# Patient Record
Sex: Male | Born: 1985 | Race: White | Hispanic: No | Marital: Married | Smoking: Never smoker
Health system: Southern US, Community
[De-identification: ages and names within clinical notes are randomized; demographics above are authoritative.]

## PROBLEM LIST (undated history)

## (undated) DIAGNOSIS — J45909 Unspecified asthma, uncomplicated: Secondary | ICD-10-CM

## (undated) DIAGNOSIS — J3089 Other allergic rhinitis: Secondary | ICD-10-CM

## (undated) DIAGNOSIS — Z889 Allergy status to unspecified drugs, medicaments and biological substances status: Secondary | ICD-10-CM

## (undated) HISTORY — PX: TYMPANOSTOMY: SHX2586

---

## 2007-05-01 ENCOUNTER — Emergency Department (HOSPITAL_COMMUNITY): Admission: EM | Admit: 2007-05-01 | Discharge: 2007-05-01 | Payer: Self-pay | Admitting: Emergency Medicine

## 2011-01-05 LAB — POCT URINALYSIS DIP (DEVICE)
Bilirubin Urine: NEGATIVE
Glucose, UA: NEGATIVE
Ketones, ur: NEGATIVE
Nitrite: NEGATIVE
pH: 7

## 2012-09-19 DIAGNOSIS — J309 Allergic rhinitis, unspecified: Secondary | ICD-10-CM | POA: Insufficient documentation

## 2015-09-17 ENCOUNTER — Emergency Department
Admission: EM | Admit: 2015-09-17 | Discharge: 2015-09-17 | Disposition: A | Discharge status: Home / Self Care | Payer: BLUE CROSS/BLUE SHIELD | Source: Home / Self Care | Attending: Family Medicine | Admitting: Family Medicine

## 2015-09-17 DIAGNOSIS — J209 Acute bronchitis, unspecified: Secondary | ICD-10-CM | POA: Diagnosis not present

## 2015-09-17 HISTORY — DX: Allergy status to unspecified drugs, medicaments and biological substances: Z88.9

## 2015-09-17 MED ORDER — BENZONATATE 200 MG PO CAPS: 200.0000 mg | ORAL_CAPSULE | Freq: Three times a day (TID) | ORAL | Status: DC | PRN

## 2015-09-17 MED ORDER — AZITHROMYCIN 250 MG PO TABS: 250.0000 mg | ORAL_TABLET | Freq: Every day | ORAL | Status: DC

## 2015-09-17 MED ORDER — HYDROCODONE-HOMATROPINE 5-1.5 MG/5ML PO SYRP: 5.0000 mL | ORAL_SOLUTION | Freq: Three times a day (TID) | ORAL | Status: DC | PRN

## 2015-09-17 MED ORDER — PREDNISONE 10 MG PO TABS: 30.0000 mg | ORAL_TABLET | Freq: Every day | ORAL | Status: DC
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
## 2015-09-17 NOTE — ED Notes (Signed)
Marc ModenaJeremiah complains of sweats, cough and congestion for 4 days.

## 2015-09-17 NOTE — Discharge Instructions (Signed)
Thank you for coming in today. Take prednisone.  Use azithromycin antibiotics if not better.  Use cough medicine as needed.  Use hycodan sparingly and do not drive after taking it because it will make you sleepy.  Call or go to the emergency room if you get worse, have trouble breathing, have chest pains, or palpitations.    Acute Bronchitis Bronchitis is inflammation of the airways that extend from the windpipe into the lungs (bronchi). The inflammation often causes mucus to develop. This leads to a cough, which is the most common symptom of bronchitis.  In acute bronchitis, the condition usually develops suddenly and goes away over time, usually in a couple weeks. Smoking, allergies, and asthma can make bronchitis worse. Repeated episodes of bronchitis may cause further lung problems.  CAUSES Acute bronchitis is most often caused by the same virus that causes a cold. The virus can spread from person to person (contagious) through coughing, sneezing, and touching contaminated objects. SIGNS AND SYMPTOMS   Cough.   Fever.   Coughing up mucus.   Body aches.   Chest congestion.   Chills.   Shortness of breath.   Sore throat.  DIAGNOSIS  Acute bronchitis is usually diagnosed through a physical exam. Your health care provider will also ask you questions about your medical history. Tests, such as chest X-rays, are sometimes done to rule out other conditions.  TREATMENT  Acute bronchitis usually goes away in a couple weeks. Oftentimes, no medical treatment is necessary. Medicines are sometimes given for relief of fever or cough. Antibiotic medicines are usually not needed but may be prescribed in certain situations. In some cases, an inhaler may be recommended to help reduce shortness of breath and control the cough. A cool mist vaporizer may also be used to help thin bronchial secretions and make it easier to clear the chest.  HOME CARE INSTRUCTIONS  Get plenty of rest.    Drink enough fluids to keep your urine clear or pale yellow (unless you have a medical condition that requires fluid restriction). Increasing fluids may help thin your respiratory secretions (sputum) and reduce chest congestion, and it will prevent dehydration.   Take medicines only as directed by your health care provider.  If you were prescribed an antibiotic medicine, finish it all even if you start to feel better.  Avoid smoking and secondhand smoke. Exposure to cigarette smoke or irritating chemicals will make bronchitis worse. If you are a smoker, consider using nicotine gum or skin patches to help control withdrawal symptoms. Quitting smoking will help your lungs heal faster.   Reduce the chances of another bout of acute bronchitis by washing your hands frequently, avoiding people with cold symptoms, and trying not to touch your hands to your mouth, nose, or eyes.   Keep all follow-up visits as directed by your health care provider.  SEEK MEDICAL CARE IF: Your symptoms do not improve after 1 week of treatment.  SEEK IMMEDIATE MEDICAL CARE IF:  You develop an increased fever or chills.   You have chest pain.   You have severe shortness of breath.  You have bloody sputum.   You develop dehydration.  You faint or repeatedly feel like you are going to pass out.  You develop repeated vomiting.  You develop a severe headache. MAKE SURE YOU:   Understand these instructions.  Will watch your condition.  Will get help right away if you are not doing well or get worse.   This information is not intended  to replace advice given to you by your health care provider. Make sure you discuss any questions you have with your health care provider.   Document Released: 05/11/2004 Document Revised: 04/24/2014 Document Reviewed: 09/24/2012 Elsevier Interactive Patient Education [employer].

## 2015-09-24 ENCOUNTER — Ambulatory Visit (INDEPENDENT_AMBULATORY_CARE_PROVIDER_SITE_OTHER): Payer: BLUE CROSS/BLUE SHIELD | Admitting: Family Medicine

## 2015-09-24 ENCOUNTER — Encounter: Payer: Self-pay | Admitting: Family Medicine

## 2015-09-24 VITALS — BP 123/80 | HR 60 | Ht 74.0 in | Wt 240.0 lb

## 2015-09-24 DIAGNOSIS — L309 Dermatitis, unspecified: Secondary | ICD-10-CM | POA: Diagnosis not present

## 2015-09-24 DIAGNOSIS — Z789 Other specified health status: Secondary | ICD-10-CM

## 2015-09-24 DIAGNOSIS — Z720 Tobacco use: Secondary | ICD-10-CM | POA: Insufficient documentation

## 2015-09-24 DIAGNOSIS — IMO0001 Reserved for inherently not codable concepts without codable children: Secondary | ICD-10-CM

## 2015-09-24 DIAGNOSIS — Z0000 Encounter for general adult medical examination without abnormal findings: Secondary | ICD-10-CM

## 2015-09-24 MED ORDER — TRIAMCINOLONE ACETONIDE 0.1 % EX CREA: 1.0000 "application " | TOPICAL_CREAM | Freq: Two times a day (BID) | CUTANEOUS | Status: DC

## 2015-09-24 NOTE — Progress Notes (Signed)
       Marc BurnsJeremiah Lee is a 30 y.o. male who [facility] [place] today for well visit.  Patient works as a Nutritional therapistelectrical lineman. He uses oral tobacco and has frequent problems with allergies and serous otitis media. Occasionally he has a rash in his groin and axilla that he attributes to his personal protective equipment gear he is required to wear at work. He uses over-the-counter creams for this which helped some.  Otherwise he feels great with no other medical issues. No fevers or chills nausea vomiting or diarrhea. He gets lots of physical exercise at work and tries to eat as healthily as he can. He is not interested in quitting oral tobacco at this time.   Past Medical History  Diagnosis Date  . Multiple allergies    History reviewed. No pertinent past surgical history. Social History  Substance Use Topics  . Smoking status: Never Smoker   . Smokeless tobacco: Current User  . Alcohol Use: Yes   family history is not on file.  ROS as above:  Medications: Current Outpatient Prescriptions  Medication Sig Dispense Refill  . cetirizine (ZYRTEC) 10 MG tablet Take 10 mg by mouth daily.    Marland Kitchen. triamcinolone cream (KENALOG) 0.1 % Apply 1 application topically 2 (two) times daily. 453.6 g 5   No current facility-administered medications for this visit.   Allergies  Allergen Reactions  . Augmentin [Amoxicillin-Pot Clavulanate] Other (See Comments)    vomiting     Exam:  BP 123/80 mmHg  Pulse 60  Ht 6\' 2"  (1.88 m)  Wt 240 lb (108.863 kg)  BMI 30.80 kg/m2 Gen: Well NAD HEENT: EOMI,  MMM Tympanic membranes are retracted and scarred bilaterally with no fluid. Lungs: Normal work of breathing. CTABL Heart: RRR no MRG Abd: NABS, Soft. Nondistended, Nontender Exts: Brisk capillary refill, warm and well perfused.  Skin: Erythematous macular rash right axilla.  Nontender.  No results found for this or any previous visit (from the past 24 hour(s)). No results found.    Assessment and Plan: 30 y.o. male with  Will visit: Medications today include oral tobacco use and a irritant dermatitis in the right axilla. Encourage patient to reduce or quit oral tobacco usage and will use triamcinolone cream for the rash in the axilla. Otherwise for a sick well visit issues will check basic labs and see the patient yearly as needed.  Discussed warning signs or symptoms. Please see discharge instructions. Patient expresses understanding.

## 2015-09-24 NOTE — Patient Instructions (Signed)
Thank you for coming in today. Call or go to the emergency room if you get worse, have trouble breathing, have chest pains, or palpitations.  Please quit oral tobacco.  Get labs.  Use the cream as needed.  Return in 1 year or sooner if needed.

## 2015-09-25 LAB — HEMOGLOBIN A1C
HEMOGLOBIN A1C: 5.5 % (ref <5.7)
MEAN PLASMA GLUCOSE: 111 mg/dL

## 2015-09-25 LAB — CBC
HEMATOCRIT: 46.3 % (ref 38.5–50.0)
HEMOGLOBIN: 15.9 g/dL (ref 13.2–17.1)
MCH: 28.1 pg (ref 27.0–33.0)
MCHC: 34.3 g/dL (ref 32.0–36.0)
MCV: 81.9 fL (ref 80.0–100.0)
MPV: 9.9 fL (ref 7.5–12.5)
Platelets: 202 10*3/uL (ref 140–400)
RBC: 5.65 MIL/uL (ref 4.20–5.80)
RDW: 13.3 % (ref 11.0–15.0)
WBC: 8.1 10*3/uL (ref 3.8–10.8)

## 2015-09-25 LAB — COMPREHENSIVE METABOLIC PANEL
ALBUMIN: 4.3 g/dL (ref 3.6–5.1)
ALT: 32 U/L (ref 9–46)
AST: 22 U/L (ref 10–40)
Alkaline Phosphatase: 70 U/L (ref 40–115)
BUN: 10 mg/dL (ref 7–25)
CALCIUM: 9.4 mg/dL (ref 8.6–10.3)
CHLORIDE: 101 mmol/L (ref 98–110)
CO2: 25 mmol/L (ref 20–31)
CREATININE: 0.93 mg/dL (ref 0.60–1.35)
Glucose, Bld: 92 mg/dL (ref 65–99)
Potassium: 4.7 mmol/L (ref 3.5–5.3)
SODIUM: 138 mmol/L (ref 135–146)
TOTAL PROTEIN: 6.8 g/dL (ref 6.1–8.1)
Total Bilirubin: 0.6 mg/dL (ref 0.2–1.2)

## 2015-09-25 LAB — TSH: TSH: 0.81 mIU/L (ref 0.40–4.50)

## 2015-09-25 LAB — LIPID PANEL
CHOLESTEROL: 220 mg/dL — AB (ref 125–200)
HDL: 31 mg/dL — ABNORMAL LOW (ref >=40)
LDL Cholesterol: 126 mg/dL (ref <130)
TRIGLYCERIDES: 315 mg/dL — AB (ref <150)
Total CHOL/HDL Ratio: 7.1 Ratio — ABNORMAL HIGH (ref <=5.0)
VLDL: 63 mg/dL — AB (ref <30)

## 2015-09-27 ENCOUNTER — Encounter: Payer: Self-pay | Admitting: Family Medicine

## 2015-09-27 DIAGNOSIS — E785 Hyperlipidemia, unspecified: Secondary | ICD-10-CM | POA: Insufficient documentation

## 2015-09-27 NOTE — Progress Notes (Signed)
Quick Note:  Labs look ok except Cholesterol which is pretty bad. Work on reducing carbs in the diet. We will recheck in 1 year. Eventually your risk factors will be high enough to recommend cholesterol medicines but they are not there yet. ______

## 2015-12-13 ENCOUNTER — Ambulatory Visit: Payer: BLUE CROSS/BLUE SHIELD | Admitting: Family Medicine

## 2015-12-24 ENCOUNTER — Encounter: Payer: Self-pay | Admitting: Sports Medicine

## 2015-12-24 ENCOUNTER — Ambulatory Visit (INDEPENDENT_AMBULATORY_CARE_PROVIDER_SITE_OTHER): Payer: BLUE CROSS/BLUE SHIELD | Admitting: Sports Medicine

## 2015-12-24 ENCOUNTER — Ambulatory Visit (INDEPENDENT_AMBULATORY_CARE_PROVIDER_SITE_OTHER): Payer: BLUE CROSS/BLUE SHIELD

## 2015-12-24 DIAGNOSIS — M545 Low back pain, unspecified: Secondary | ICD-10-CM

## 2015-12-24 DIAGNOSIS — J209 Acute bronchitis, unspecified: Secondary | ICD-10-CM

## 2015-12-24 DIAGNOSIS — R05 Cough: Secondary | ICD-10-CM | POA: Diagnosis not present

## 2015-12-24 DIAGNOSIS — M4806 Spinal stenosis, lumbar region: Secondary | ICD-10-CM | POA: Diagnosis not present

## 2015-12-24 DIAGNOSIS — M5136 Other intervertebral disc degeneration, lumbar region: Secondary | ICD-10-CM | POA: Diagnosis not present

## 2015-12-24 MED ORDER — MELOXICAM 15 MG PO TABS: ORAL_TABLET | ORAL | 3 refills | Status: DC

## 2015-12-24 MED ORDER — BENZONATATE 200 MG PO CAPS: 200.0000 mg | ORAL_CAPSULE | Freq: Three times a day (TID) | ORAL | 0 refills | Status: DC | PRN

## 2015-12-24 MED ORDER — AZITHROMYCIN 250 MG PO TABS: ORAL_TABLET | ORAL | 0 refills | Status: DC

## 2015-12-24 NOTE — Assessment & Plan Note (Signed)
Pain is predominantly axial and discogenic. Home rehabilitation, meloxicam, x-rays. Has failed chiropractic manipulation, massage joint provided short-term relief. We did discuss the anatomy and natural history of back pain and degenerative disc disease.

## 2015-12-24 NOTE — Progress Notes (Signed)
   Subjective:    I'm seeing this patient as a consultation for:  Dr. Clementeen GrahamEvan Corey  CC: Back pain, cough  HPI: This is a pleasant 30 year old male, he's been coughing for a month now, he does not smoke but he is exposed to a great deal of secondhand smoke. Cough is minimally productive of yellowish sputum, no constitutional symptoms, no shortness of breath, cough does keep him up at night.  Low back pain: Right-sided, axial, worse with sitting, flexion Valsalva, no bowel or bladder dysfunction, saddle numbness, no constitutional symptoms.  Past medical history, Surgical history, Family history not pertinant except as noted below, Social history, Allergies, and medications have been entered into the medical record, reviewed, and no changes needed.   Review of Systems: No headache, visual changes, nausea, vomiting, diarrhea, constipation, dizziness, abdominal pain, skin rash, fevers, chills, night sweats, weight loss, swollen lymph nodes, body aches, joint swelling, muscle aches, chest pain, shortness of breath, mood changes, visual or auditory hallucinations.   Objective:   General: Well Developed, well nourished, and in no acute distress.  Neuro/Psych: Alert and oriented x3, extra-ocular muscles intact, able to move all 4 extremities, sensation grossly intact. Skin: Warm and dry, no rashes noted.  Respiratory: Not using accessory muscles, speaking in full sentences, trachea midline.  Cardiovascular: Pulses palpable, no extremity edema. Abdomen: Does not appear distended. Back Exam:  Inspection: Unremarkable  Motion: Flexion 45 deg, Extension 45 deg, Side Bending to 45 deg bilaterally,  Rotation to 45 deg bilaterally  SLR laying: Negative  XSLR laying: Negative  Palpable tenderness: None. FABER: negative. Sensory change: Gross sensation intact to all lumbar and sacral dermatomes.  Reflexes: 2+ at both patellar tendons, 2+ at achilles tendons, Babinski's downgoing.  Strength at foot    Plantar-flexion: 5/5 Dorsi-flexion: 5/5 Eversion: 5/5 Inversion: 5/5  Leg strength  Quad: 5/5 Hamstring: 5/5 Hip flexor: 5/5 Hip abductors: 5/5  Gait unremarkable. Ears nose and throat: Oropharynx, nasopharynx, ear canals unremarkable.  Impression and Recommendations:   This case required medical decision making of moderate complexity.  Acute bronchitis Chest x-ray, Tessalon Perles, azithromycin, symptoms present for one month.  Low back pain Pain is predominantly axial and discogenic. Home rehabilitation, meloxicam, x-rays. Has failed chiropractic manipulation, massage joint provided short-term relief. We did discuss the anatomy and natural history of back pain and degenerative disc disease.  I spent 25 minutes with this patient, greater than 50% was face-to-face time counseling regarding the above diagnoses

## 2015-12-24 NOTE — Assessment & Plan Note (Signed)
Chest x-ray, Tessalon Perles, azithromycin, symptoms present for one month.

## 2016-01-19 ENCOUNTER — Ambulatory Visit (INDEPENDENT_AMBULATORY_CARE_PROVIDER_SITE_OTHER): Payer: BLUE CROSS/BLUE SHIELD | Admitting: Family Medicine

## 2016-01-19 ENCOUNTER — Encounter: Payer: Self-pay | Admitting: Family Medicine

## 2016-01-19 VITALS — BP 117/62 | HR 61 | Ht 74.0 in | Wt 239.0 lb

## 2016-01-19 DIAGNOSIS — B029 Zoster without complications: Secondary | ICD-10-CM | POA: Diagnosis not present

## 2016-01-19 DIAGNOSIS — R238 Other skin changes: Secondary | ICD-10-CM | POA: Diagnosis not present

## 2016-01-19 MED ORDER — LIDOCAINE 4 % EX CREA: 1.0000 "application " | TOPICAL_CREAM | Freq: Three times a day (TID) | CUTANEOUS | 0 refills | Status: DC | PRN

## 2016-01-19 MED ORDER — VALACYCLOVIR HCL 1 G PO TABS: 1000.0000 mg | ORAL_TABLET | Freq: Three times a day (TID) | ORAL | 0 refills | Status: DC

## 2016-01-19 NOTE — Progress Notes (Signed)
Subjective:    Patient ID: Marc Lee, male    DOB: 18-Apr-1985, 30 y.o.   MRN: [id number]  HPI 30 year old male comes in today with a rash over the left flank area.  He noticed it about 4 days ago. He says it's tender to touch but otherwise is not extremely painful. He denies any itching or irritation. 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 That he doesn't normally breakout for poison ivy etc. He did switch soaps about a month ago. No fevers or chills. He reports that the pain does radiate towards the front abdominal area and occasionally will radiate towards the spine but it's intermittent.  Review of Systems  BP 117/62   Pulse 61   Ht 6\' 2"  (1.88 m)   Wt 239 lb (108.4 kg)   BMI 30.69 kg/m     Allergies  Allergen Reactions  . Augmentin [Amoxicillin-Pot Clavulanate] Other (See Comments)    vomiting    Past Medical History:  Diagnosis Date  . Multiple allergies     No past surgical history on file.  Social History   Social History  . Marital status: Married    Spouse name: N/A  . Number of children: N/A  . Years of education: N/A   Occupational History  . Not on file.   Social History Main Topics  . Smoking status: Never Smoker  . Smokeless tobacco: Current User  . Alcohol use Yes  . Drug use: No  . Sexual activity: Not on file   Other Topics Concern  . Not on file   Social History Narrative  . No narrative on file    No family history on file.  Outpatient Encounter Prescriptions as of 01/19/2016  Medication Sig  . cetirizine (ZYRTEC) 10 MG tablet Take 10 mg by mouth daily.  . meloxicam (MOBIC) 15 MG tablet One tab PO qAM with breakfast for 2 weeks, then daily prn pain.  . valACYclovir (VALTREX) 1000 MG tablet Take 1 tablet (1,000 mg total) by mouth 3 (three) times daily. X 7 day  . [DISCONTINUED] azithromycin (ZITHROMAX Z-PAK) 250 MG tablet Take 2 tablets (500 mg) on  Day 1,  followed by 1 tablet (250 mg) once  daily on Days 2 through 5.  . [DISCONTINUED] benzonatate (TESSALON) 200 MG capsule Take 1 capsule (200 mg total) by mouth 3 (three) times daily as needed for cough.  . [DISCONTINUED] triamcinolone cream (KENALOG) 0.1 % Apply 1 application topically 2 (two) times daily. (Patient not taking: Reported on 12/24/2015)   No facility-administered encounter medications on file as of 01/19/2016.        Objective:   Physical Exam  Constitutional: He is oriented to person, place, and time. He appears well-developed and well-nourished.  HENT:  Head: Normocephalic and atraumatic.  Neurological: He is oriented to person, place, and time.  Skin: Skin is warm and dry. Rash noted.  Erythematous rash with some fine vesicles within the papular area.    Psychiatric: He has a normal mood and affect. His behavior is normal.            Assessment & Plan:  Vesicular rash-blade used to lance a couple of vesicles and fluid was collected to send for evaluation for herpes zoster and herpes simplex. Will call with results once available. Also consider contact dermatitis. He is actually leaving town today summoning go ahead and put him on an antiviral and can use lidocaine cream for  pain relief. Call if rash is spreading or if he notices new lesions.

## 2016-01-23 LAB — RFLXH. SIMPLEX/VZ VIRUS CULT/DIF

## 2016-01-26 LAB — VIRAL CULTURE VIRC

## 2016-06-15 ENCOUNTER — Encounter: Payer: Self-pay | Admitting: Physician Assistant

## 2016-06-15 ENCOUNTER — Ambulatory Visit (INDEPENDENT_AMBULATORY_CARE_PROVIDER_SITE_OTHER): Payer: BLUE CROSS/BLUE SHIELD | Admitting: Physician Assistant

## 2016-06-15 VITALS — BP 131/78 | HR 77 | Temp 97.8°F | Wt 246.0 lb

## 2016-06-15 DIAGNOSIS — H6983 Other specified disorders of Eustachian tube, bilateral: Secondary | ICD-10-CM

## 2016-06-15 DIAGNOSIS — J069 Acute upper respiratory infection, unspecified: Secondary | ICD-10-CM

## 2016-06-15 MED ORDER — AZITHROMYCIN 250 MG PO TABS: ORAL_TABLET | ORAL | 0 refills | Status: DC

## 2016-06-15 MED ORDER — BENZONATATE 200 MG PO CAPS: 200.0000 mg | ORAL_CAPSULE | Freq: Three times a day (TID) | ORAL | 0 refills | Status: DC | PRN

## 2016-06-15 MED ORDER — FLUTICASONE PROPIONATE 50 MCG/ACT NA SUSP: 2.0000 | Freq: Every day | NASAL | 6 refills | Status: DC

## 2016-06-15 NOTE — Patient Instructions (Addendum)
Flonase 1 spray each nostril twice a day Take antibiotic as prescribed

## 2016-06-15 NOTE — Progress Notes (Signed)
HPI:                                                                Marc BurnsJeremiah Lee is a 31 y.o. male who [facility] [place] today for cough and cold symptoms  Patient reports cough and sinus congestion x 2 weeks. States today developed bilateral ear fullness and he has the sensation that ears need to pop. Patient reports a remote history of chronic ear infections requiring multiple tympanostomy tubes. He is not currently followed by ENT.  Endorses cough productive of mucus, wheezing, and dyspnea. Denies chest pain or hemoptysis. Denies fever, chills, malaise. Nonsmoker. Denies history of asthma/COPD. He has tried OTC sinus medication and 2 left-over antibiotic pills. 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   Past Medical History:  Diagnosis Date  . Multiple allergies    No past surgical history on file. Social History  Substance Use Topics  . Smoking status: Never Smoker  . Smokeless tobacco: Current User  . Alcohol use Yes   family history is not on file.  ROS: negative except as noted in the HPI  Medications: Current Outpatient Prescriptions  Medication Sig Dispense Refill  . cetirizine (ZYRTEC) 10 MG tablet Take 10 mg by mouth daily.    Marland Kitchen. lidocaine (LMX) 4 % cream Apply 1 application topically 3 (three) times daily as needed. Ok to substitute different strength if this one not covered. 120 g 0  . meloxicam (MOBIC) 15 MG tablet One tab PO qAM with breakfast for 2 weeks, then daily prn pain. 30 tablet 3  . valACYclovir (VALTREX) 1000 MG tablet Take 1 tablet (1,000 mg total) by mouth 3 (three) times daily. X 7 day 21 tablet 0   No current facility-administered medications for this visit.    Allergies  Allergen Reactions  . Augmentin [Amoxicillin-Pot Clavulanate] Other (See Comments)    vomiting       Objective:  BP 131/78   Pulse 77   Temp 97.8 F (36.6 C) (Oral)   Wt 246 lb  (111.6 kg)   BMI 31.58 kg/m  Gen: well-groomed, cooperative, not ill-appearing, no distress HEENT: normal conjunctiva, TM's retracted, nasal mucosa edematous, oropharynx clear, tonsils grade 3, no exudates, moist mucus membranes, no frontal or maxillary sinus tenderness Pulm: Normal work of breathing, normal phonation, clear to auscultation bilaterally, no wheezes, rales or rhonchi CV: Normal rate, regular rhythm, s1 and s2 distinct, no murmurs, clicks or rubs  Neuro: alert and oriented x 3, EOM's intact Lymph: no cervical or tonsillar adenopathy Skin: warm and dry, no rashes or lesions on exposed skin, no cyanosis   No results found for this or any previous visit (from the past 72 hour(s)). No results found.    Assessment and Plan: 31 y.o. male with   1. Dysfunction of both eustachian tubes - fluticasone (FLONASE) 50 MCG/ACT nasal spray; Place 2 sprays into both nostrils daily.  Dispense: 16 g; Refill: 6  2. Acute upper respiratory infection - will cover for CAP given duration of symptoms and patient will be going out of town for work. Lung sounds clear today. - azithromycin (ZITHROMAX Z-PAK) 250 MG tablet; Take 2 tablets (500 mg) on  Day 1,  followed by 1 tablet (250 mg) once daily on Days 2 through 5.  Dispense: 6 tablet; Refill: 0 - benzonatate (TESSALON) 200 MG capsule; Take 1 capsule (200 mg total) by mouth 3 (three) times daily as needed for cough.  Dispense: 45 capsule; Refill: 0   Patient education and anticipatory guidance given Patient agrees with treatment plan Follow-up as needed if symptoms worsen or fail to improve  Levonne Hubert PA-C

## 2016-11-09 ENCOUNTER — Ambulatory Visit (INDEPENDENT_AMBULATORY_CARE_PROVIDER_SITE_OTHER): Payer: BLUE CROSS/BLUE SHIELD | Admitting: Family Medicine

## 2016-11-09 ENCOUNTER — Encounter: Payer: Self-pay | Admitting: Family Medicine

## 2016-11-09 VITALS — BP 128/77 | HR 81 | Wt 253.0 lb

## 2016-11-09 DIAGNOSIS — H60331 Swimmer's ear, right ear: Secondary | ICD-10-CM | POA: Diagnosis not present

## 2016-11-09 MED ORDER — AZITHROMYCIN 250 MG PO TABS: 250.0000 mg | ORAL_TABLET | Freq: Every day | ORAL | 0 refills | Status: DC

## 2016-11-09 MED ORDER — NEOMYCIN-POLYMYXIN-HC 3.5-10000-1 OT SOLN: 4.0000 [drp] | Freq: Four times a day (QID) | OTIC | 0 refills | Status: DC

## 2016-11-09 NOTE — Patient Instructions (Signed)
Thank you for coming in today. Use the antibiotic ear drop for a few days.  Use backup oral antibiotic if not getting better.   Use rubbing alcohol to prevent the next one.    Otitis Externa Otitis externa is an infection of the outer ear canal. The outer ear canal is the area between the outside of the ear and the eardrum. Otitis externa is sometimes called "swimmer's ear." What are the causes? This condition may be caused by:  Swimming in dirty water.  Moisture in the ear.  An injury to the inside of the ear.  An object stuck in the ear.  A cut or scrape on the outside of the ear.  What increases the risk? This condition is more likely to develop in swimmers. What are the signs or symptoms? The first symptom of this condition is often itching in the ear. Later signs and symptoms include:  Swelling of the ear.  Redness in the ear.  Ear pain. The pain may get worse when you pull on your ear.  Pus coming from the ear.  How is this diagnosed? This condition may be diagnosed by examining the ear and testing fluid from the ear for bacteria and funguses. How is this treated? This condition may be treated with:  Antibiotic ear drops. These are often given for 10-14 days.  Medicine to reduce itching and swelling.  Follow these instructions at home:  If you were prescribed antibiotic ear drops, apply them as told by your health care provider. Do not stop using the antibiotic even if your condition improves.  Take over-the-counter and prescription medicines only as told by your health care provider.  Keep all follow-up visits as told by your health care provider. This is important. How is this prevented?  Keep your ear dry. Use the corner of a towel to dry your ear after you swim or bathe.  Avoid scratching or putting things in your ear. Doing these things can damage the ear canal or remove the protective wax that lines it, which makes it easier for bacteria and funguses  to grow.  Avoid swimming in lakes, polluted water, or pools that may not have the right amount of chlorine.  Consider making ear drops and putting 3 or 4 drops in each ear after you swim. Ask your health care provider about how you can make ear drops. Contact a health care provider if:  You have a fever.  After 3 days your ear is still red, swollen, painful, or draining pus.  Your redness, swelling, or pain gets worse.  You have a severe headache.  You have redness, swelling, pain, or tenderness in the area behind your ear. This information is not intended to replace advice given to you by your health care provider. Make sure you discuss any questions you have with your health care provider. Document Released: 04/03/2005 Document Revised: 05/11/2015 Document Reviewed: 01/11/2015 Elsevier Interactive Patient Education  [employer]2018 [employer]

## 2016-11-09 NOTE — Progress Notes (Signed)
Marc BurnsJeremiah Lee is a 31 y.o. male who [facility] today for ear pain.  Patient states that ear pain began about 5 days ago. He has an extensive history of ear infections and states that this feels exactly like those episodes. He reports decreased hearing during this period. Patient just returned from a vacation to the beach during which he swam often. He denies any fevers or sinus congestion. Patient reports some pain which chewing, but states this has improved since the onset of his symptoms.    Past Medical History:  Diagnosis Date  . Multiple allergies    Past Surgical History:  Procedure Laterality Date  . TYMPANOSTOMY     Social History  Substance Use Topics  . Smoking status: Never Smoker  . Smokeless tobacco: Current User  . Alcohol use Yes   family history is not on file.  ROS as above:  Medications: Current Outpatient Prescriptions  Medication Sig Dispense Refill  . cetirizine (ZYRTEC) 10 MG tablet Take 10 mg by mouth daily.    Marland Kitchen. azithromycin (ZITHROMAX) 250 MG tablet Take 1 tablet (250 mg total) by mouth daily. Take first 2 tablets together, then 1 every day until finished. 6 tablet 0  . neomycin-polymyxin-hydrocortisone (CORTISPORIN) OTIC solution Place 4 drops into the right ear 4 (four) times daily. 10 mL 0   No current facility-administered medications for this visit.    Allergies  Allergen Reactions  . Augmentin [Amoxicillin-Pot Clavulanate] Other (See Comments)    vomiting    Health Maintenance Health Maintenance  Topic Date Due  . HIV Screening  10/25/1999  . TETANUS/TDAP  10/25/2003  . INFLUENZA VACCINE  11/15/2016     Exam:  BP 128/77   Pulse 81   Wt 253 lb (114.8 kg)   SpO2 98%   BMI 32.48 kg/m  Gen: Well NAD HEENT: EOMI,  MMM, skin peeling and sunburn present on ears, no ear discharge noted, erythema present  in external ear canal, tympanic membranes appear white and opaque, no bulging appreciated Lungs: Normal work of breathing. CTABL Heart: RRR no MRG Abd: NABS, Soft. Nondistended, Nontender Exts: Brisk capillary refill, warm and well perfused.    No results found for this or any previous visit (from the past 72 hour(s)). No results found.    Assessment and Plan: 31 y.o. male with ear pain. Given patient's history of ear infections and recent activities, this is most likely otitis externa. Patient was given a prescription for Cortisporin ear drops and instructed to use these 4 times daily. Patient should expect symptoms to resolve in the next few days. If symptoms do not resolve, patient should take azithromycin and continue to monitor symptoms for resolution. Patient was instructed to apply a few drops of rubbing alcohol to the ear after each swimming session to help dry out ear and reduce number of ear infections.   No orders of the defined types were placed in this encounter.  Meds ordered this encounter  Medications  . neomycin-polymyxin-hydrocortisone (CORTISPORIN) OTIC solution    Sig: Place 4 drops into the right ear 4 (four) times daily.    Dispense:  10 mL    Refill:  0  . azithromycin (ZITHROMAX) 250 MG tablet    Sig: Take 1 tablet (250 mg total) by mouth daily. Take first 2 tablets together, then 1 every day until finished.    Dispense:  6 tablet    Refill:  0  Discussed warning signs or symptoms. Please see discharge instructions. Patient expresses understanding.

## 2017-02-11 ENCOUNTER — Encounter: Payer: Self-pay | Admitting: Emergency Medicine

## 2017-02-11 ENCOUNTER — Emergency Department
Admission: EM | Admit: 2017-02-11 | Discharge: 2017-02-11 | Disposition: A | Discharge status: Home / Self Care | Payer: BLUE CROSS/BLUE SHIELD | Source: Home / Self Care

## 2017-02-11 DIAGNOSIS — J0120 Acute ethmoidal sinusitis, unspecified: Secondary | ICD-10-CM | POA: Diagnosis not present

## 2017-02-11 MED ORDER — AZITHROMYCIN 250 MG PO TABS: 250.0000 mg | ORAL_TABLET | Freq: Every day | ORAL | 0 refills | Status: DC

## 2017-02-11 MED ORDER — HYDROCODONE-HOMATROPINE 5-1.5 MG/5ML PO SYRP: 5.0000 mL | ORAL_SOLUTION | Freq: Four times a day (QID) | ORAL | 0 refills | Status: DC | PRN

## 2017-02-11 NOTE — ED Triage Notes (Signed)
Patient has been congested and coughing for 4 days; believes he may have a sinus infection. Took tylenol and mucinex dm 5 hours ago.

## 2017-02-11 NOTE — ED Provider Notes (Signed)
[facility]    CSN: [id number] Arrival date & time: 02/11/17  1557     History   Chief Complaint No chief complaint on file.   HPI Marc Lee is a 31 y.o. male.   The history is provided by the patient. No language interpreter was used.  Cough  Cough characteristics:  Productive Sputum characteristics:  Manson Passey Severity:  Moderate Onset quality:  Gradual Duration:  4 days Timing:  Constant Progression:  Worsening Chronicity:  New Smoker: no   Context: sick contacts   Relieved by:  Nothing Worsened by:  Nothing Ineffective treatments:  None tried Associated symptoms: sinus congestion   Associated symptoms: no chest pain and no fever   Pt reports a history of sinus infections.  Pt complains of not being able to sleep due to cough.   Past Medical History:  Diagnosis Date  . Multiple allergies     Patient Active Problem List   Diagnosis Date Noted  . Dysfunction of both eustachian tubes 06/15/2016  . Low back pain 12/24/2015  . Acute bronchitis 12/24/2015  . Hyperlipidemia 09/27/2015  . Well adult 09/24/2015  . Dermatitis 09/24/2015  . Tobacco dipper 09/24/2015  . Allergic rhinitis 09/19/2012    Past Surgical History:  Procedure Laterality Date  . TYMPANOSTOMY         Home Medications    Prior to Admission medications   Medication Sig Start Date End Date Taking? Authorizing Provider  azithromycin (ZITHROMAX) 250 MG tablet Take 1 tablet (250 mg total) by mouth daily. Take first 2 tablets together, then 1 every day until finished. 11/09/16   Rodolph Bong, MD  cetirizine (ZYRTEC) 10 MG tablet Take 10 mg by mouth daily.    [provider]  neomycin-polymyxin-hydrocortisone (CORTISPORIN) OTIC solution Place 4 drops into the right ear 4 (four) times daily. 11/09/16   Rodolph Bong, MD    Family History No family history on file.  Social History Social History  Substance Use Topics  . Smoking status: Never Smoker  . Smokeless  tobacco: Current User  . Alcohol use Yes     Allergies   Augmentin [amoxicillin-pot clavulanate]   Review of Systems Review of Systems  Constitutional: Negative for fever.  Respiratory: Positive for cough.   Cardiovascular: Negative for chest pain.  All other systems reviewed and are negative.    Physical Exam Triage Vital Signs ED Triage Vitals  Enc Vitals Group     BP      Pulse      Resp      Temp      Temp src      SpO2      Weight      Height      Head Circumference      Peak Flow      Pain Score      Pain Loc      Pain Edu?      Excl. in GC?    No data found.   Updated Vital Signs There were no vitals taken for this visit.  Visual Acuity Right Eye Distance:   Left Eye Distance:   Bilateral Distance:    Right Eye Near:   Left Eye Near:    Bilateral Near:     Physical Exam  Constitutional: He appears well-developed and well-nourished.  HENT:  Head: Normocephalic and atraumatic.  Right Ear: External ear normal.  Left Ear: External ear normal.  Tender maxillary sinuses.  Eyes: Conjunctivae are normal.  Neck: Neck supple.  Cardiovascular: Normal rate and regular rhythm.   No murmur heard. Pulmonary/Chest: Effort normal and breath sounds normal. No respiratory distress.  Abdominal: Soft. There is no tenderness.  Musculoskeletal: He exhibits no edema.  Neurological: He is alert.  Skin: Skin is warm and dry.  Psychiatric: He has a normal mood and affect.  Nursing note and vitals reviewed.    UC Treatments / Results  Labs (all labs ordered are listed, but only abnormal results are displayed) Labs Reviewed - No data to display  EKG  EKG Interpretation None       Radiology No results found.  Procedures Procedures (including critical care time)  Medications Ordered in UC Medications - No data to display   Initial Impression / Assessment and Plan / UC Course  I have reviewed the triage vital signs and the nursing  notes.  Pertinent labs & imaging results that were available during my care of the patient were reviewed by me and considered in my medical decision making (see chart for details).       Final Clinical Impressions(s) / UC Diagnoses   Final diagnoses:  Acute non-recurrent ethmoidal sinusitis    New Prescriptions New Prescriptions   HYDROCODONE-HOMATROPINE (HYCODAN) 5-1.5 MG/5ML SYRUP    Take 5 mLs by mouth every 6 (six) hours as needed for cough.   Meds ordered this encounter  Medications  . azithromycin (ZITHROMAX) 250 MG tablet    Sig: Take 1 tablet (250 mg total) by mouth daily. Take first 2 tablets together, then 1 every day until finished.    Dispense:  6 tablet    Refill:  0    Order Specific Question:   Supervising Provider    Answer:   Georgina PillionMASSEY, DAVID [[id number]]  . HYDROcodone-homatropine (HYCODAN) 5-1.5 MG/5ML syrup    Sig: Take 5 mLs by mouth every 6 (six) hours as needed for cough.    Dispense:  100 mL    Refill:  0    Order Specific Question:   Supervising Provider    Answer:   Georgina PillionMASSEY, DAVID [[id number]]  An After Visit Summary was printed and given to the patient.    Controlled Substance Prescriptions [place] Controlled Substance Registry consulted? Yes, I have consulted the [place] Controlled Substances Registry for this patient, and feel the risk/benefit ratio today is favorable for proceeding with this prescription for a controlled substance.   Elson AreasSofia, Leslie K, [place]-C 02/11/17 1723

## 2017-04-15 ENCOUNTER — Other Ambulatory Visit: Payer: Self-pay

## 2017-04-15 ENCOUNTER — Emergency Department
Admission: EM | Admit: 2017-04-15 | Discharge: 2017-04-15 | Disposition: A | Discharge status: Home / Self Care | Payer: BLUE CROSS/BLUE SHIELD | Source: Home / Self Care | Attending: Family Medicine | Admitting: Family Medicine

## 2017-04-15 ENCOUNTER — Encounter: Payer: Self-pay | Admitting: Emergency Medicine

## 2017-04-15 DIAGNOSIS — H6691 Otitis media, unspecified, right ear: Secondary | ICD-10-CM

## 2017-04-15 DIAGNOSIS — J039 Acute tonsillitis, unspecified: Secondary | ICD-10-CM

## 2017-04-15 DIAGNOSIS — Z20818 Contact with and (suspected) exposure to other bacterial communicable diseases: Secondary | ICD-10-CM

## 2017-04-15 MED ORDER — AMOXICILLIN 500 MG PO CAPS: 500.0000 mg | ORAL_CAPSULE | Freq: Two times a day (BID) | ORAL | 0 refills | Status: AC

## 2017-04-15 NOTE — ED Provider Notes (Signed)
[facility]    CSN: [id number] Arrival date & time: 04/15/17  1206     History   Chief Complaint Chief Complaint  Patient presents with  . Sore Throat  . Otalgia    right    HPI Briscoe BurnsJeremiah Lizardo is a 31 y.o. male.   HPI  Briscoe BurnsJeremiah Kirkland is a 31 y.o. male presenting to UC with c/o 3-4 days of Right ear pain and sore throat with minimal congestion.  His son recently tested positive for strep throat.  Pt denies fever, chills, n/v/d. He has not tried any OTC medications for his symptoms but wanted to make sure he did not need an antibiotic.    Past Medical History:  Diagnosis Date  . Multiple allergies     Patient Active Problem List   Diagnosis Date Noted  . Dysfunction of both eustachian tubes 06/15/2016  . Low back pain 12/24/2015  . Acute bronchitis 12/24/2015  . Hyperlipidemia 09/27/2015  . Well adult 09/24/2015  . Dermatitis 09/24/2015  . Tobacco dipper 09/24/2015  . Allergic rhinitis 09/19/2012    Past Surgical History:  Procedure Laterality Date  . TYMPANOSTOMY         Home Medications    Prior to Admission medications   Medication Sig Start Date End Date Taking? Authorizing Provider  amoxicillin (AMOXIL) 500 MG capsule Take 1 capsule (500 mg total) by mouth 2 (two) times daily for 10 days. 04/15/17 04/25/17  Lurene ShadowPhelps, Sage Kopera O, PA-C  azithromycin (ZITHROMAX) 250 MG tablet Take 1 tablet (250 mg total) by mouth daily. Take first 2 tablets together, then 1 every day until finished. 02/11/17   Elson AreasSofia, Leslie K, PA-C  cetirizine (ZYRTEC) 10 MG tablet Take 10 mg by mouth daily.    [provider]  HYDROcodone-homatropine (HYCODAN) 5-1.5 MG/5ML syrup Take 5 mLs by mouth every 6 (six) hours as needed for cough. 02/11/17   Elson AreasSofia, Leslie K, PA-C  neomycin-polymyxin-hydrocortisone (CORTISPORIN) OTIC solution Place 4 drops into the right ear 4 (four) times daily. 11/09/16   Rodolph Bongorey, Evan S, MD    Family History History reviewed. No pertinent family  history.  Social History Social History   Tobacco Use  . Smoking status: Never Smoker  . Smokeless tobacco: Current User  Substance Use Topics  . Alcohol use: Yes  . Drug use: No     Allergies   Augmentin [amoxicillin-pot clavulanate]   Review of Systems Review of Systems  Constitutional: Negative for chills and fever.  HENT: Positive for congestion (minimal), ear pain (Right) and sore throat. Negative for trouble swallowing and voice change.   Respiratory: Negative for cough and shortness of breath.   Cardiovascular: Negative for chest pain and palpitations.  Gastrointestinal: Negative for abdominal pain, diarrhea, nausea and vomiting.  Musculoskeletal: Negative for arthralgias, back pain and myalgias.  Skin: Negative for rash.  Neurological: Positive for headaches. Negative for dizziness and light-headedness.     Physical Exam Triage Vital Signs ED Triage Vitals  Enc Vitals Group     BP 04/15/17 1234 104/70     Pulse Rate 04/15/17 1234 70     Resp 04/15/17 1234 16     Temp 04/15/17 1234 98.5 F (36.9 C)     Temp Source 04/15/17 1234 Oral     SpO2 04/15/17 1234 98 %     Weight 04/15/17 1235 238 lb (108 kg)     Height 04/15/17 1235 6\' 1"  (1.854 m)     Head Circumference --  Peak Flow --      Pain Score 04/15/17 1235 3     Pain Loc --      Pain Edu? --      Excl. in GC? --    No data found.  Updated Vital Signs BP 104/70 (BP Location: Right Arm)   Pulse 70   Temp 98.5 F (36.9 C) (Oral)   Resp 16   Ht 6\' 1"  (1.854 m)   Wt 238 lb (108 kg)   SpO2 98%   BMI 31.40 kg/m   Visual Acuity Right Eye Distance:   Left Eye Distance:   Bilateral Distance:    Right Eye Near:   Left Eye Near:    Bilateral Near:     Physical Exam  Constitutional: He is oriented to person, place, and time. He appears well-developed and well-nourished.  Non-toxic appearance. He does not appear ill. No distress.  HENT:  Head: Normocephalic and atraumatic.  Right Ear:  Tympanic membrane is erythematous and bulging.  Left Ear: Tympanic membrane is injected.  Nose: Nose normal.  Mouth/Throat: Uvula is midline and mucous membranes are normal. Posterior oropharyngeal edema and posterior oropharyngeal erythema present. No oropharyngeal exudate or tonsillar abscesses.  Eyes: EOM are normal.  Neck: Normal range of motion. Neck supple.  Cardiovascular: Normal rate and regular rhythm.  Pulmonary/Chest: Effort normal and breath sounds normal. No stridor. No respiratory distress. He has no wheezes. He has no rhonchi. He has no rales.  Musculoskeletal: Normal range of motion.  Lymphadenopathy:    He has cervical adenopathy.  Neurological: He is alert and oriented to person, place, and time.  Skin: Skin is warm and dry.  Psychiatric: He has a normal mood and affect. His behavior is normal.  Nursing note and vitals reviewed.    UC Treatments / Results  Labs (all labs ordered are listed, but only abnormal results are displayed) Labs Reviewed - No data to display  EKG  EKG Interpretation None       Radiology No results found.  Procedures Procedures (including critical care time)  Medications Ordered in UC Medications - No data to display   Initial Impression / Assessment and Plan / UC Course  I have reviewed the triage vital signs and the nursing notes.  Pertinent labs & imaging results that were available during my care of the patient were reviewed by me and considered in my medical decision making (see chart for details).     Exam c/w Right AOM and tonsillitis. Pt will be started on Amoxicillin for AOM, discussed strep test. Pt agreeable to hold off as pt will be on appropriate antibiotic regardless of results.  Encouraged fluids, rest, acetaminophen and ibuprofen F/u with PCP in 1 week if needed.   Final Clinical Impressions(s) / UC Diagnoses   Final diagnoses:  Right acute otitis media  Acute tonsillitis, unspecified etiology  Exposure  to strep throat    ED Discharge Orders        Ordered    amoxicillin (AMOXIL) 500 MG capsule  2 times daily     04/15/17 1307       Controlled Substance Prescriptions [place] Controlled Substance Registry consulted? Not Applicable   Rolla Platehelps, Jerell Demery O, PA-C 04/15/17 1354

## 2017-04-15 NOTE — Discharge Instructions (Signed)
°  You may take 500mg acetaminophen every 4-6 hours or in combination with ibuprofen 400-600mg every 6-8 hours as needed for pain, inflammation, and fever. ° °Be sure to drink at least eight 8oz glasses of water to stay well hydrated and get at least 8 hours of sleep at night, preferably more while sick.  ° °Please take antibiotics as prescribed and be sure to complete entire course even if you start to feel better to ensure infection does not come back. ° °

## 2017-04-15 NOTE — ED Triage Notes (Signed)
Patient has son with Strep and ear infection; he began with sore throat and right ear pain 'couple days ago'; no known fever; no OTCs today.

## 2017-04-18 ENCOUNTER — Telehealth: Payer: Self-pay

## 2017-04-18 NOTE — Telephone Encounter (Signed)
Spoke with patient, doing well.  Will take medication as prescribed.  Will follow up with UC or PCP as needed.

## 2017-11-03 ENCOUNTER — Emergency Department (INDEPENDENT_AMBULATORY_CARE_PROVIDER_SITE_OTHER): Payer: BLUE CROSS/BLUE SHIELD

## 2017-11-03 ENCOUNTER — Emergency Department (INDEPENDENT_AMBULATORY_CARE_PROVIDER_SITE_OTHER)
Admission: EM | Admit: 2017-11-03 | Discharge: 2017-11-03 | Disposition: A | Discharge status: Home / Self Care | Payer: BLUE CROSS/BLUE SHIELD | Source: Home / Self Care | Attending: Family Medicine | Admitting: Family Medicine

## 2017-11-03 ENCOUNTER — Other Ambulatory Visit: Payer: Self-pay

## 2017-11-03 DIAGNOSIS — M25532 Pain in left wrist: Secondary | ICD-10-CM | POA: Diagnosis not present

## 2017-11-03 DIAGNOSIS — S6992XA Unspecified injury of left wrist, hand and finger(s), initial encounter: Secondary | ICD-10-CM | POA: Diagnosis not present

## 2017-11-03 DIAGNOSIS — S63502A Unspecified sprain of left wrist, initial encounter: Secondary | ICD-10-CM | POA: Diagnosis not present

## 2017-11-03 MED ORDER — ACETAMINOPHEN 325 MG PO TABS
975.0000 mg | ORAL_TABLET | Freq: Once | ORAL | Status: AC
  Administered 2017-11-03: 975 mg via ORAL

## 2017-11-03 NOTE — ED Provider Notes (Signed)
[facility]    CSN: [id number] Arrival date & time: 11/03/17  1002     History   Chief Complaint Chief Complaint  Patient presents with  . Wrist Pain    HPI Briscoe BurnsJeremiah Falter is a 32 y.o. male.   Patient fell off a ladder yesterday, bracing himself with his hands.  He subsequently had pain/swelling in his left wrist.  The history is provided by the patient.  Wrist Pain  This is a new problem. The current episode started yesterday. The problem occurs constantly. The problem has not changed since onset.Exacerbated by: wrist movement. Nothing relieves the symptoms. Treatments tried: ice pack. The treatment provided mild relief.    Past Medical History:  Diagnosis Date  . Multiple allergies     Patient Active Problem List   Diagnosis Date Noted  . Dysfunction of both eustachian tubes 06/15/2016  . Low back pain 12/24/2015  . Acute bronchitis 12/24/2015  . Hyperlipidemia 09/27/2015  . Well adult 09/24/2015  . Dermatitis 09/24/2015  . Tobacco dipper 09/24/2015  . Allergic rhinitis 09/19/2012    Past Surgical History:  Procedure Laterality Date  . TYMPANOSTOMY         Home Medications    Prior to Admission medications   Medication Sig Start Date End Date Taking? Authorizing Provider  azithromycin (ZITHROMAX) 250 MG tablet Take 1 tablet (250 mg total) by mouth daily. Take first 2 tablets together, then 1 every day until finished. 02/11/17   Elson AreasSofia, Leslie K, PA-C  cetirizine (ZYRTEC) 10 MG tablet Take 10 mg by mouth daily.    [provider]  HYDROcodone-homatropine (HYCODAN) 5-1.5 MG/5ML syrup Take 5 mLs by mouth every 6 (six) hours as needed for cough. 02/11/17   Elson AreasSofia, Leslie K, PA-C  neomycin-polymyxin-hydrocortisone (CORTISPORIN) OTIC solution Place 4 drops into the right ear 4 (four) times daily. 11/09/16   Rodolph Bongorey, Evan S, MD    Family History No family history on file.  Social History Social History   Tobacco Use  . Smoking status:  Never Smoker  . Smokeless tobacco: Current User  Substance Use Topics  . Alcohol use: Yes  . Drug use: No     Allergies   Augmentin [amoxicillin-pot clavulanate]   Review of Systems Review of Systems  All other systems reviewed and are negative.    Physical Exam Triage Vital Signs ED Triage Vitals  Enc Vitals Group     BP 11/03/17 1026 112/71     Pulse Rate 11/03/17 1026 62     Resp 11/03/17 1026 16     Temp 11/03/17 1026 97.6 F (36.4 C)     Temp Source 11/03/17 1026 Oral     SpO2 11/03/17 1026 98 %     Weight 11/03/17 1027 243 lb (110.2 kg)     Height 11/03/17 1027 6\' 1"  (1.854 m)     Head Circumference --      Peak Flow --      Pain Score 11/03/17 1027 8     Pain Loc --      Pain Edu? --      Excl. in GC? --    No data found.  Updated Vital Signs BP 112/71 (BP Location: Right Arm)   Pulse 62   Temp 97.6 F (36.4 C) (Oral)   Resp 16   Ht 6\' 1"  (1.854 m)   Wt 243 lb (110.2 kg)   SpO2 98%   BMI 32.06 kg/m   Visual Acuity Right Eye Distance:  Left Eye Distance:   Bilateral Distance:    Right Eye Near:   Left Eye Near:    Bilateral Near:     Physical Exam  Constitutional: He appears well-developed and well-nourished. No distress.  HENT:  Head: Atraumatic.  Eyes: Pupils are equal, round, and reactive to light.  Cardiovascular: Normal rate.  Pulmonary/Chest: Effort normal.  Musculoskeletal:       Left wrist: He exhibits decreased range of motion, tenderness, bony tenderness and swelling. He exhibits no effusion, no crepitus, no deformity and no laceration.       Arms: Patient has pain with ulnar lateral flexion of left wrist.  There is tenderness over the ulnar aspect of wrist distal to ulnar stylus.  Mild decreased range of motion.  No tenderness to palpation over snuffbox.  Distal neurovascular function is intact.  Neurological: He is alert.  Skin: Skin is warm and dry.  Nursing note and vitals reviewed.    UC Treatments / Results   Labs (all labs ordered are listed, but only abnormal results are displayed) Labs Reviewed - No data to display  EKG None  Radiology Dg Wrist Complete Left  Result Date: 11/03/2017 CLINICAL DATA:  Fall from ladder, ulnar wrist pain EXAM: LEFT WRIST - COMPLETE 3+ VIEW COMPARISON:  None. FINDINGS: There is no evidence of fracture or dislocation. There is no evidence of arthropathy or other focal bone abnormality. Soft tissues are unremarkable. IMPRESSION: No acute osseous finding Electronically Signed   By: Judie Petit.  Shick M.D.   On: 11/03/2017 10:46    Procedures Procedures (including critical care time)  Medications Ordered in UC Medications  acetaminophen (TYLENOL) tablet 975 mg (975 mg Oral Given 11/03/17 1034)    Initial Impression / Assessment and Plan / UC Course  I have reviewed the triage vital signs and the nursing notes.  Pertinent labs & imaging results that were available during my care of the patient were reviewed by me and considered in my medical decision making (see chart for details).    Wrist splint applied. Doubt TFCC injury, but emphasized to patient that he should follow-up with Sports Medicine if not improving in about 2 weeks.   Final Clinical Impressions(s) / UC Diagnoses   Final diagnoses:  Sprain of left wrist, initial encounter     Discharge Instructions     Apply ice pack for 20 minutes every 1 to 2 hours today and tomorrow.  Elevate.  Wear brace for about 7 to 10 days.  Begin range of motion and stretching exercises in about 5 days as per instruction sheet.  May take Ibuprofen 200mg , 4 tabs every 8 hours with food.     ED Prescriptions    None        Lattie Haw, MD 11/03/17 1108

## 2017-11-03 NOTE — Discharge Instructions (Addendum)
Apply ice pack for 20 minutes every 1 to 2 hours today and tomorrow.  Elevate.  Wear brace for about 7 to 10 days.  Begin range of motion and stretching exercises in about 5 days as per instruction sheet.  May take Ibuprofen 200mg , 4 tabs every 8 hours with food.

## 2017-11-03 NOTE — ED Triage Notes (Signed)
Patient fell off ladder yesterday and landed on left wrist which is now sore and somewhat edematous. No OTC for pain today.

## 2017-11-21 ENCOUNTER — Ambulatory Visit (INDEPENDENT_AMBULATORY_CARE_PROVIDER_SITE_OTHER): Payer: BLUE CROSS/BLUE SHIELD | Admitting: Sports Medicine

## 2017-11-21 ENCOUNTER — Ambulatory Visit (INDEPENDENT_AMBULATORY_CARE_PROVIDER_SITE_OTHER): Payer: BLUE CROSS/BLUE SHIELD

## 2017-11-21 ENCOUNTER — Encounter: Payer: Self-pay | Admitting: Sports Medicine

## 2017-11-21 DIAGNOSIS — M545 Low back pain, unspecified: Secondary | ICD-10-CM

## 2017-11-21 DIAGNOSIS — W11XXXD Fall on and from ladder, subsequent encounter: Secondary | ICD-10-CM | POA: Diagnosis not present

## 2017-11-21 DIAGNOSIS — G8929 Other chronic pain: Secondary | ICD-10-CM

## 2017-11-21 DIAGNOSIS — S6992XA Unspecified injury of left wrist, hand and finger(s), initial encounter: Secondary | ICD-10-CM

## 2017-11-21 DIAGNOSIS — M5136 Other intervertebral disc degeneration, lumbar region: Secondary | ICD-10-CM | POA: Diagnosis not present

## 2017-11-21 DIAGNOSIS — S6992XD Unspecified injury of left wrist, hand and finger(s), subsequent encounter: Secondary | ICD-10-CM | POA: Diagnosis not present

## 2017-11-21 MED ORDER — MELOXICAM 15 MG PO TABS: 15.0000 mg | ORAL_TABLET | Freq: Every day | ORAL | 3 refills | Status: DC

## 2017-11-21 MED ORDER — PREDNISONE 50 MG PO TABS: ORAL_TABLET | ORAL | 0 refills | Status: DC

## 2017-11-21 NOTE — Progress Notes (Signed)
Subjective:    I'm seeing this patient as a consultation for: Dr. Clementeen GrahamEvan Corey  CC: Several issues  HPI: Left wrist injury: 3 weeks ago fell onto an outstretched hand with hyper dorsiflexion of his wrist.  He had swelling and bruising, was seen in urgent care where x-rays were negative for any fractures, he was appropriately placed in a Velcro wrist brace, unfortunately has persistent pain, and lack of range of motion.  Pain dorsally over the radiocarpal joint.  No mechanical symptoms in the left wrist.  Lumbar degenerative disc disease: Known L4-L5 DDD, axial discogenic pain, resolved well with conservative measures about 2 years ago.  No bowel or bladder dysfunction, saddle numbness, he has had a few hyperextension injuries in the meantime.  Pain is better with standing, and back extension.  Nothing overtly radicular.  I reviewed the past medical history, family history, social history, surgical history, and allergies today and no changes were needed.  Please see the problem list section below in epic for further details.  Past Medical History: Past Medical History:  Diagnosis Date  . Multiple allergies    Past Surgical History: Past Surgical History:  Procedure Laterality Date  . TYMPANOSTOMY     Social History: Social History   Socioeconomic History  . Marital status: Married    Spouse name: Not on file  . Number of children: Not on file  . Years of education: Not on file  . Highest education level: Not on file  Occupational History  . Not on file  Social Needs  . Financial resource strain: Not on file  . Food insecurity:    Worry: Not on file    Inability: Not on file  . Transportation needs:    Medical: Not on file    Non-medical: Not on file  Tobacco Use  . Smoking status: Never Smoker  . Smokeless tobacco: Current User  Substance and Sexual Activity  . Alcohol use: Yes  . Drug use: No  . Sexual activity: Not on file  Lifestyle  . Physical activity:    Days  per week: Not on file    Minutes per session: Not on file  . Stress: Not on file  Relationships  . Social connections:    Talks on phone: Not on file    Gets together: Not on file    Attends religious service: Not on file    Active member of club or organization: Not on file    Attends meetings of clubs or organizations: Not on file    Relationship status: Not on file  Other Topics Concern  . Not on file  Social History Narrative  . Not on file   Family History: No family history on file. Allergies: Allergies  Allergen Reactions  . Augmentin [Amoxicillin-Pot Clavulanate] Other (See Comments)    vomiting   Medications: See med rec.  Review of Systems: No headache, visual changes, nausea, vomiting, diarrhea, constipation, dizziness, abdominal pain, skin rash, fevers, chills, night sweats, weight loss, swollen lymph nodes, body aches, joint swelling, muscle aches, chest pain, shortness of breath, mood changes, visual or auditory hallucinations.   Objective:   General: Well Developed, well nourished, and in no acute distress.  Neuro:  Extra-ocular muscles intact, able to move all 4 extremities, sensation grossly intact.  Deep tendon reflexes tested were normal. Psych: Alert and oriented, mood congruent with affect. ENT:  Ears and nose appear unremarkable.  Hearing grossly normal. Neck: Unremarkable overall appearance, trachea midline.  No visible  thyroid enlargement. Eyes: Conjunctivae and lids appear unremarkable.  Pupils equal and round. Skin: Warm and dry, no rashes noted.  Cardiovascular: Pulses palpable, no extremity edema. Left wrist: Minimal swelling ROM smooth and normal with good flexion and extension and ulnar/radial deviation that is symmetrical with opposite wrist. Tender to palpation over the dorsal radiocarpal joint, tenderness over the TFCC and with a mildly positive Watson's test. No snuffbox tenderness. No tenderness over Canal of Guyon. Strength 5/5 in all  directions without pain. Negative tinel's and phalens signs. Negative Finkelstein sign.  Impression and Recommendations:   This case required medical decision making of moderate complexity.  Left wrist injury Fall into an outstretched hand 3 weeks ago. Negative x-rays initially, repeating x-rays today. Positive Watson's test, also with tenderness over the TFCC suspicious for scapholunate tear as well. Considering duration of symptoms, wrist instability we are going to proceed with MR arthrogram. Continue wrist brace in the meantime.  Low back pain Known lumbar degenerative disc disease, recurrence of axial discogenic back pain. No red flags. Prednisone, meloxicam, rehab exercises, repeat x-rays, he has had several hyperextension injuries since I seen him last. Return to see me in 1 month.  ___________________________________________ Ihor Austin. Benjamin Stain, M.D., ABFM., CAQSM. Primary Care and Sports Medicine  [facility]  Adjunct Instructor of Family Medicine  [employer]

## 2017-11-21 NOTE — Assessment & Plan Note (Addendum)
Fall into an outstretched hand 3 weeks ago. Negative x-rays initially, repeating x-rays today. Positive Watson's test, also with tenderness over the TFCC suspicious for scapholunate tear as well. Considering duration of symptoms, wrist instability we are going to proceed with MR arthrogram. Continue wrist brace in the meantime.

## 2017-11-21 NOTE — Assessment & Plan Note (Signed)
Known lumbar degenerative disc disease, recurrence of axial discogenic back pain. No red flags. Prednisone, meloxicam, rehab exercises, repeat x-rays, he has had several hyperextension injuries since I seen him last. Return to see me in 1 month.

## 2017-11-26 ENCOUNTER — Ambulatory Visit: Payer: BLUE CROSS/BLUE SHIELD | Admitting: Sports Medicine

## 2017-11-26 ENCOUNTER — Other Ambulatory Visit: Payer: BLUE CROSS/BLUE SHIELD

## 2018-03-24 IMAGING — DX DG CHEST 2V
2 series · 2 of 2 positions shown · non-contrast
Comparison: None in PACs

CLINICAL DATA: One month of cough

EXAM:
CHEST  2 VIEW

[chest pa]
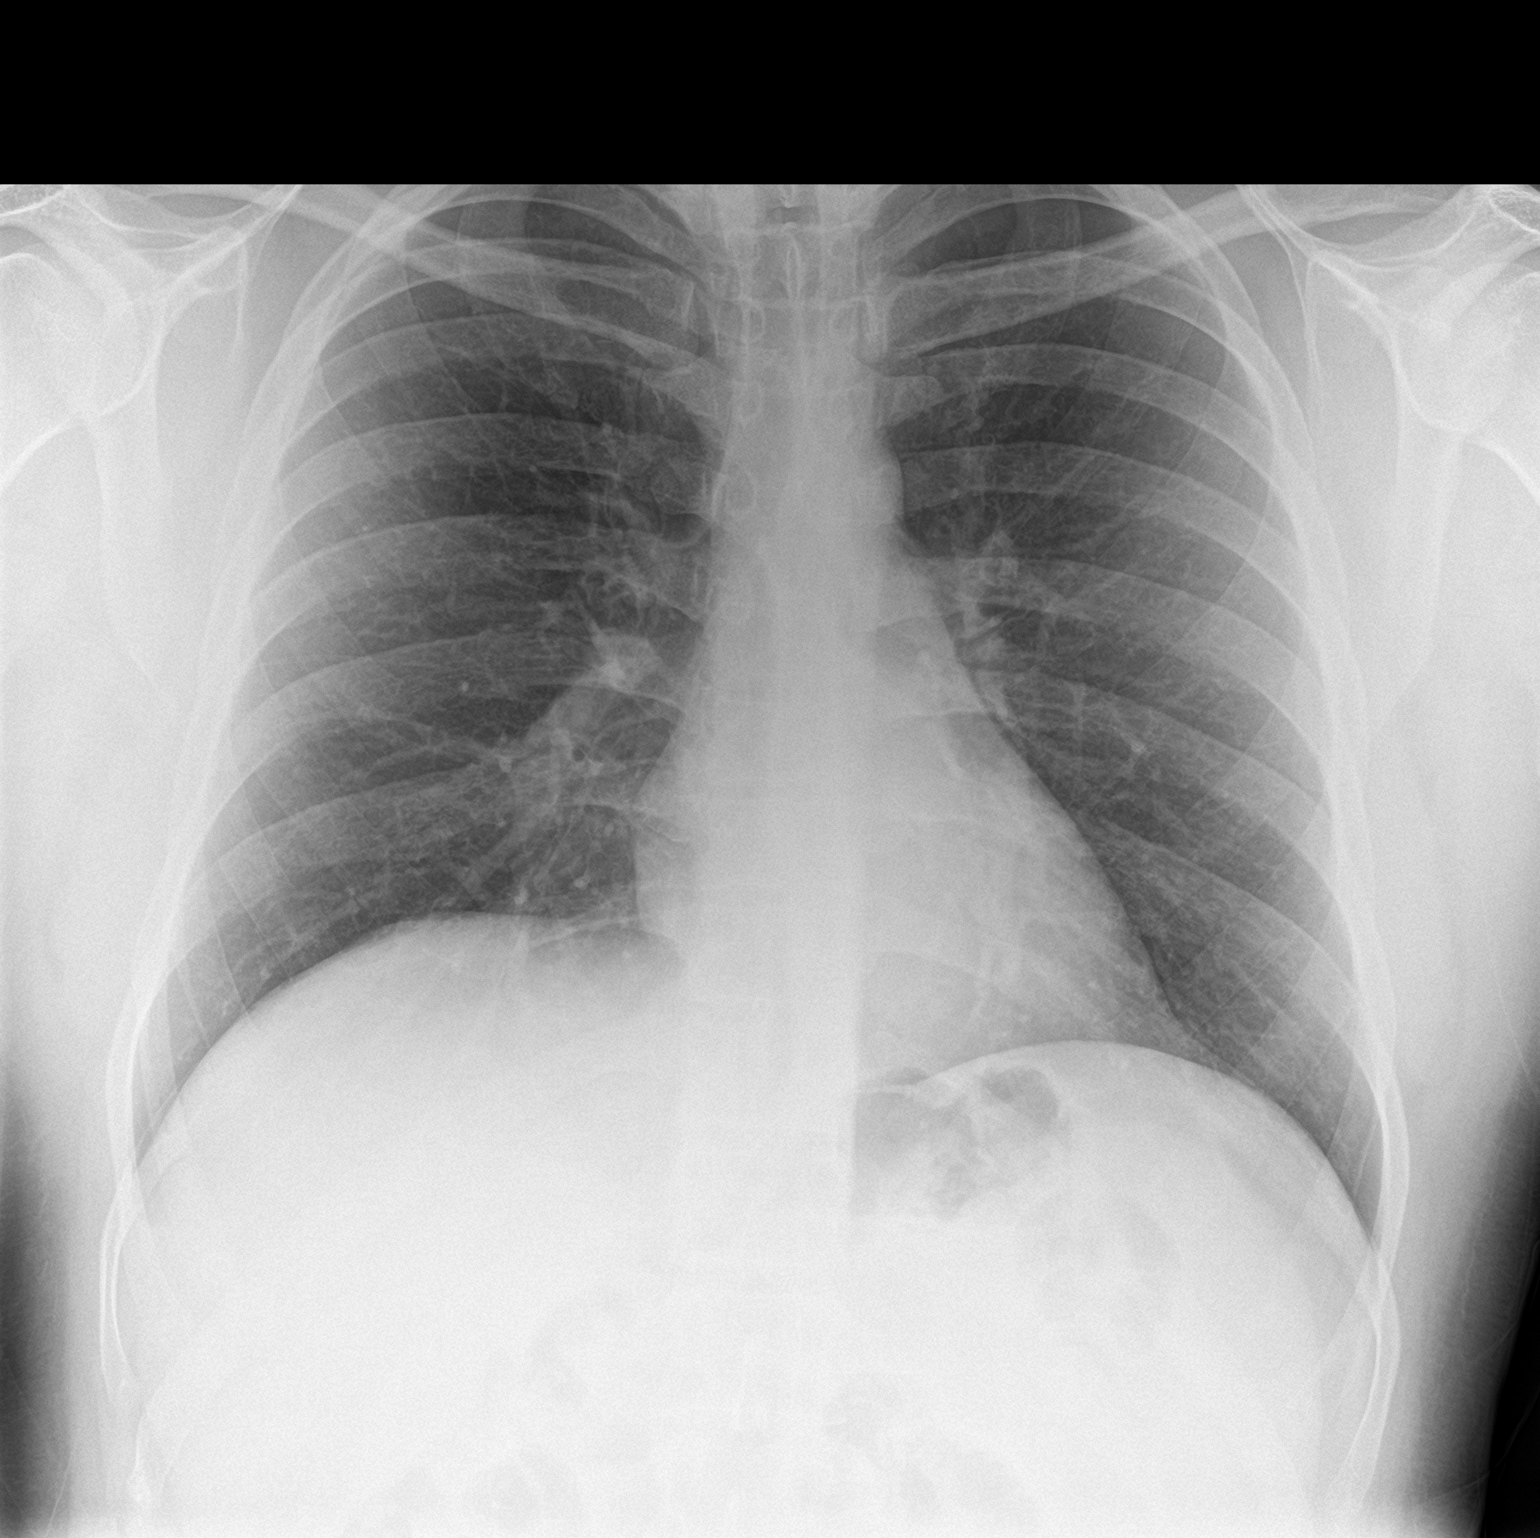

[chest lat]
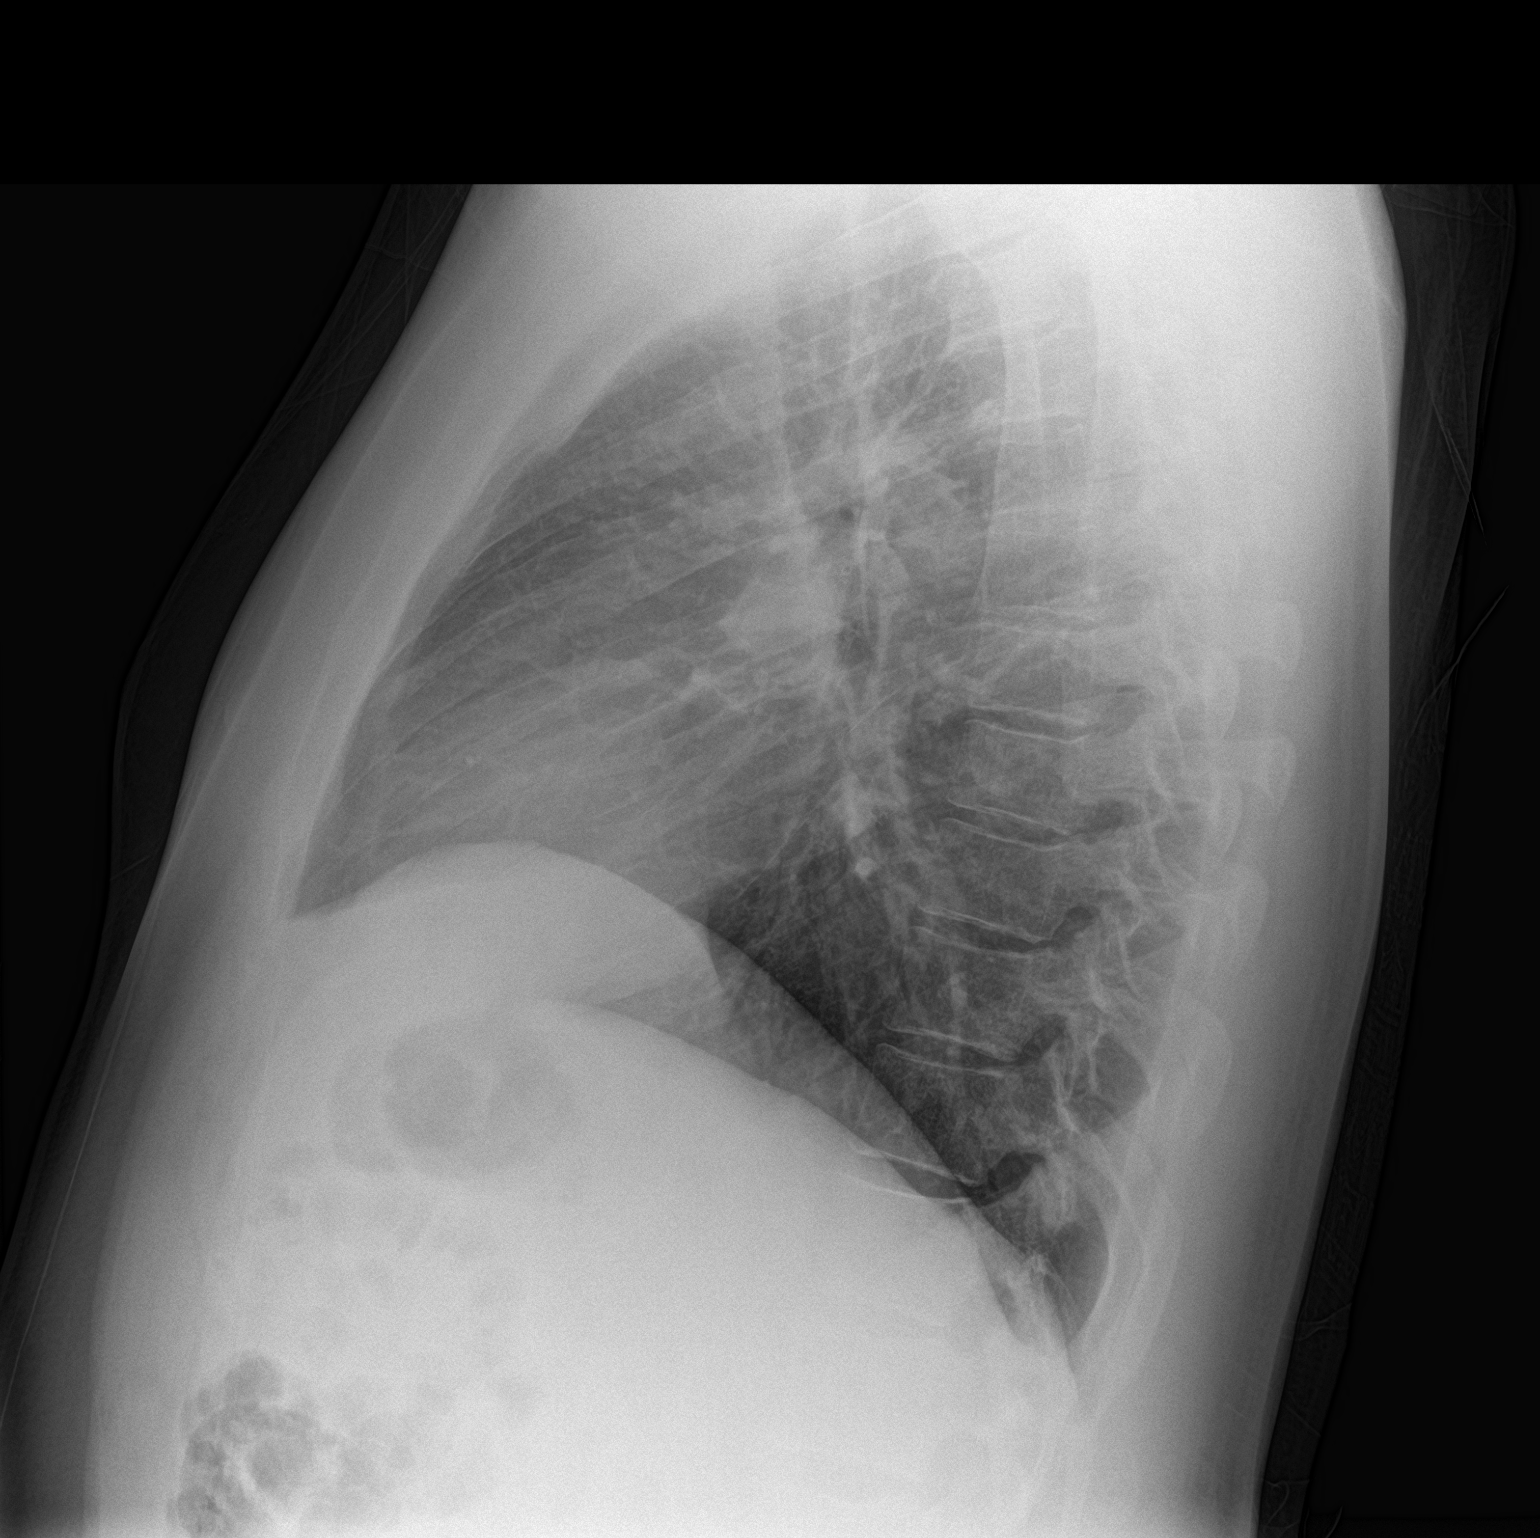

[2 of 2 positions shown; findings below may reference images not displayed]

FINDINGS: The lungs are adequately inflated and clear. The heart and pulmonary
vascularity are normal. The mediastinum is normal in width. There is
no pleural effusion. The bony thorax is unremarkable.
IMPRESSION: There is no active cardiopulmonary disease.

## 2018-03-24 IMAGING — DX DG LUMBAR SPINE COMPLETE 4+V
5 series · 5 of 5 positions shown · non-contrast
Comparison: Supine abdominal radiograph of May 01, 2007.

CLINICAL DATA: Right-sided low back pain for the past 6 months
without known injury.

EXAM:
LUMBAR SPINE - COMPLETE 4+ VIEW

[l-spine ap]
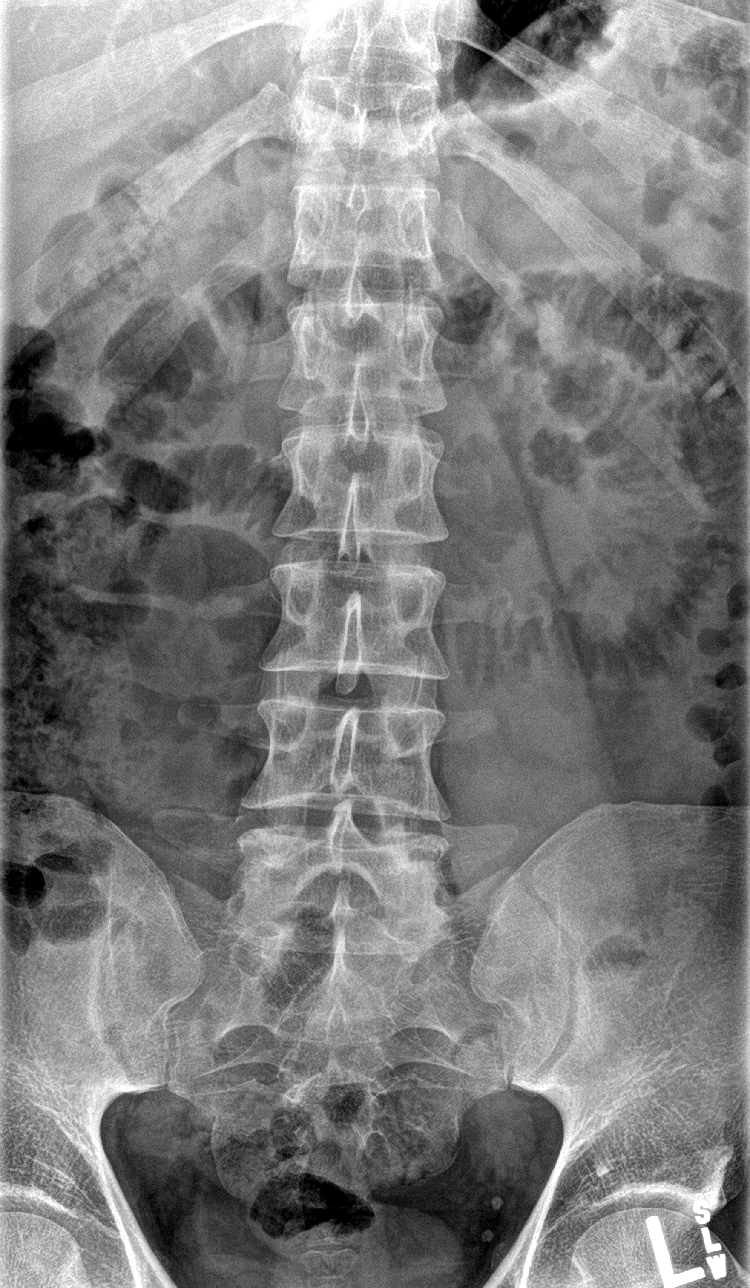

[l-spine obl (1 of 2)]
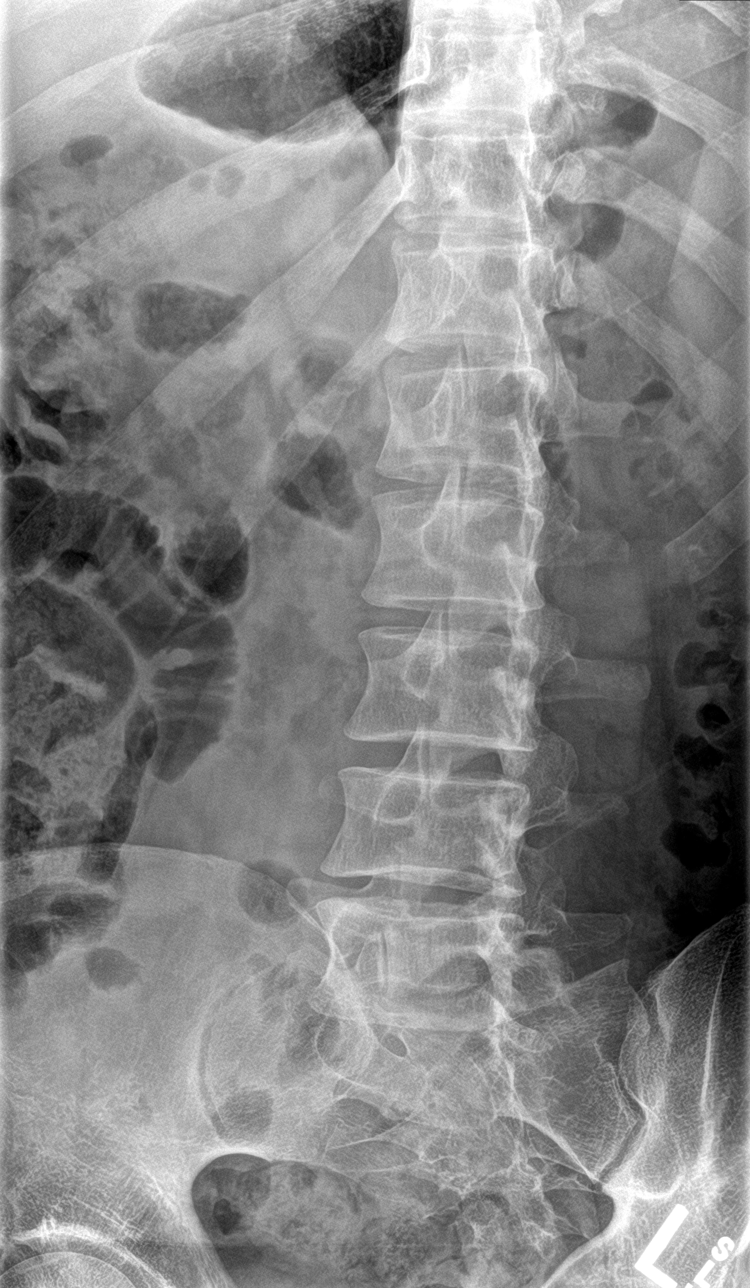

[l-spine obl (2 of 2)]
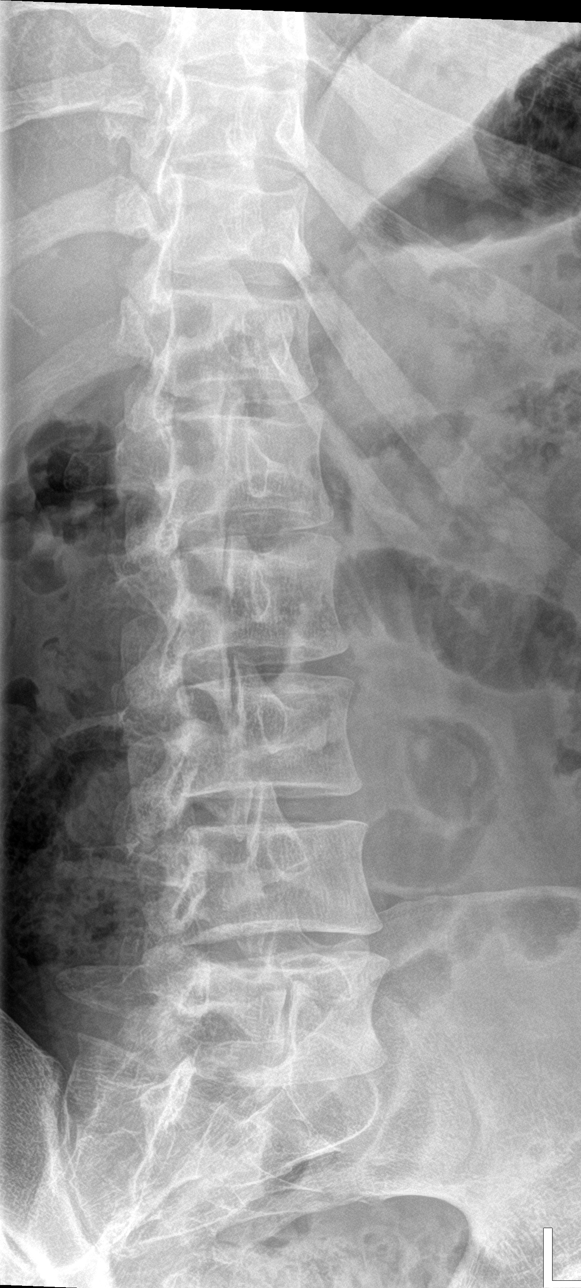

[l-spine lat]
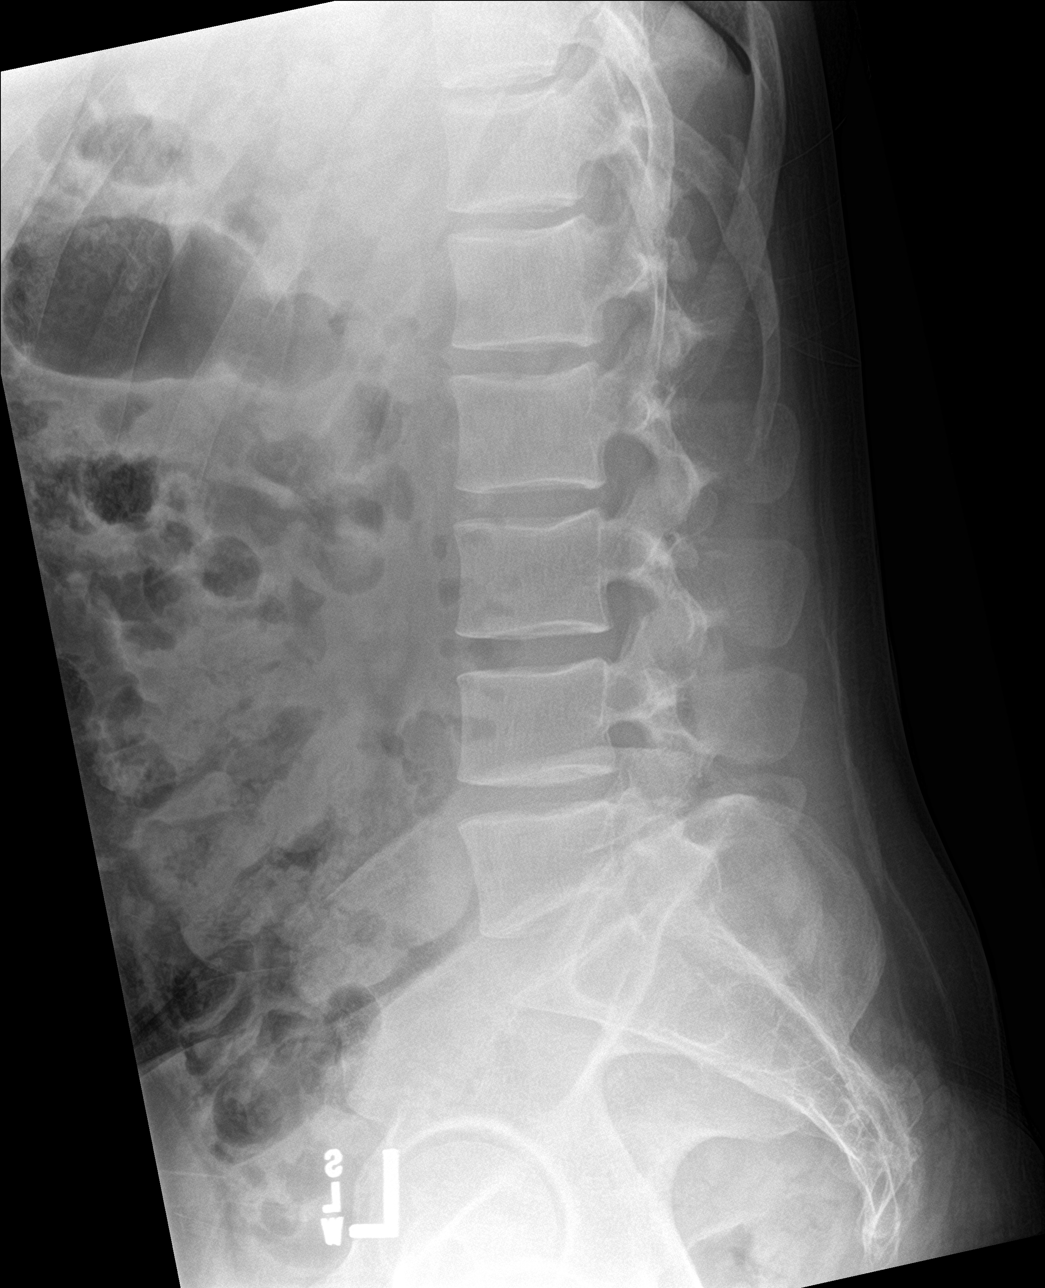

[l-spine spot]
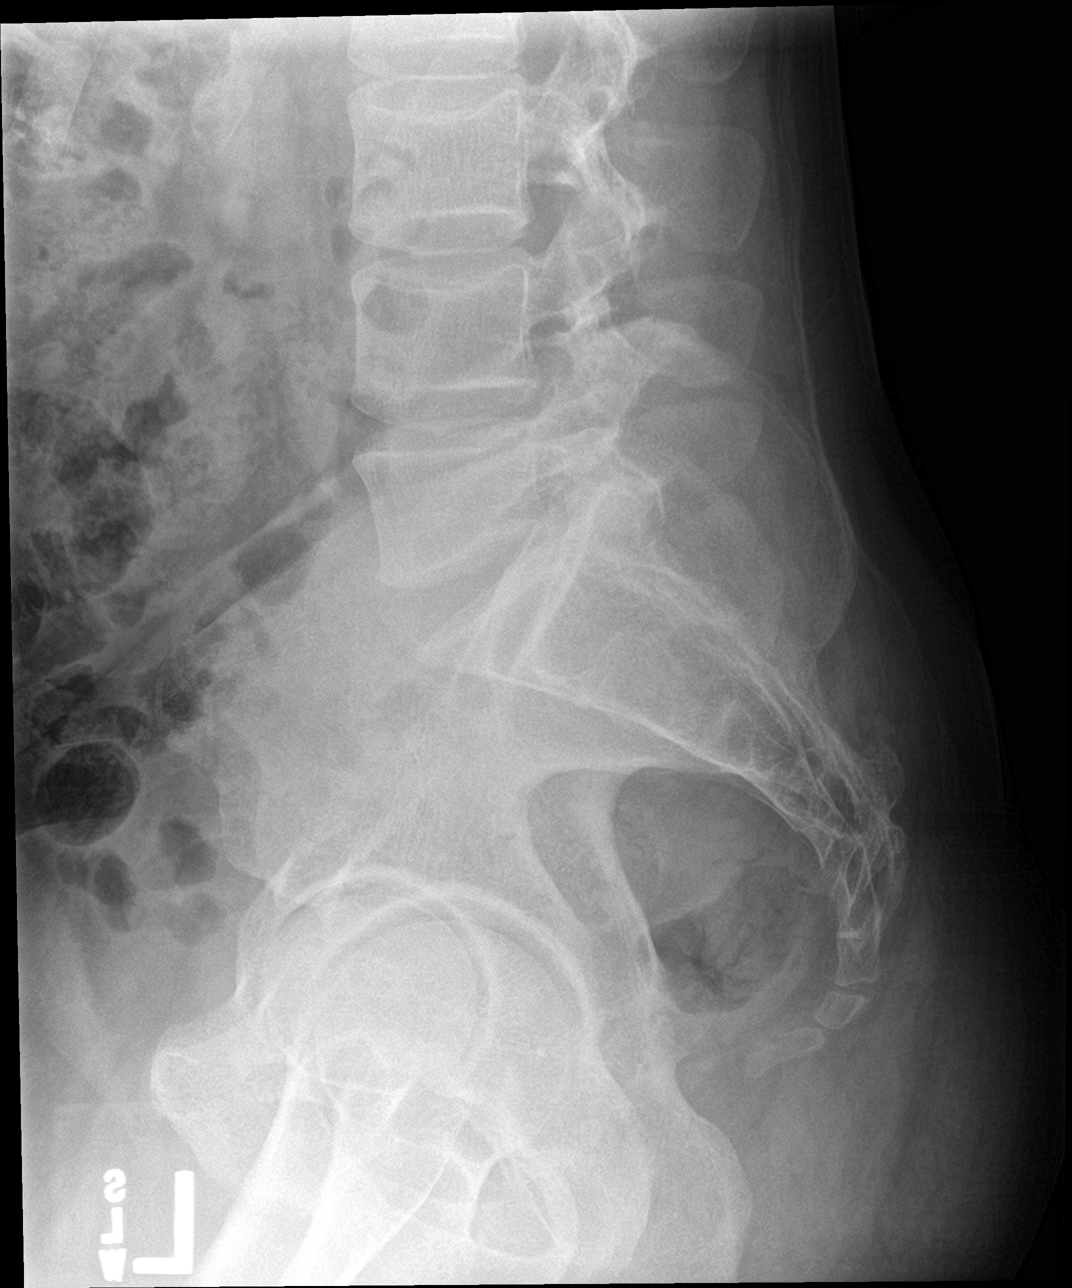

[5 of 5 positions shown; findings below may reference images not displayed]

FINDINGS: The lumbar vertebral bodies are preserved in height. There is mild
disc space narrowing at L4-5. There is no spondylolisthesis. No
significant facet joint hypertrophy is observed. The pedicles and
transverse processes are normal. The observed portions of the sacrum
are normal.
IMPRESSION: There is mild degenerative narrowing of the L4-5 disc space.
Otherwise the examination is within the limits of normal.

## 2018-04-15 ENCOUNTER — Ambulatory Visit (INDEPENDENT_AMBULATORY_CARE_PROVIDER_SITE_OTHER): Payer: BLUE CROSS/BLUE SHIELD | Admitting: Physician Assistant

## 2018-04-15 ENCOUNTER — Encounter: Payer: Self-pay | Admitting: Physician Assistant

## 2018-04-15 VITALS — BP 131/73 | HR 71 | Temp 98.5°F | Ht 73.0 in | Wt 245.0 lb

## 2018-04-15 DIAGNOSIS — H7291 Unspecified perforation of tympanic membrane, right ear: Secondary | ICD-10-CM | POA: Diagnosis not present

## 2018-04-15 DIAGNOSIS — J039 Acute tonsillitis, unspecified: Secondary | ICD-10-CM | POA: Diagnosis not present

## 2018-04-15 DIAGNOSIS — H6691 Otitis media, unspecified, right ear: Secondary | ICD-10-CM

## 2018-04-15 MED ORDER — CEFDINIR 300 MG PO CAPS: 300.0000 mg | ORAL_CAPSULE | Freq: Two times a day (BID) | ORAL | 0 refills | Status: DC

## 2018-04-15 MED ORDER — PREDNISONE 50 MG PO TABS: ORAL_TABLET | ORAL | 0 refills | Status: DC

## 2018-04-15 NOTE — Patient Instructions (Addendum)
Tonsillitis    Tonsillitis is an infection of the throat that causes the tonsils to become red, tender, and swollen. Tonsils are tissues in the back of your throat. Each tonsil has crevices (crypts). Tonsils normally work to protect the body from infection.  What are the causes?  Sudden (acute) tonsillitis may be caused by a virus or bacteria, including streptococcal bacteria. Long-lasting (chronic) tonsillitis occurs when the crypts of the tonsils become filled with pieces of food and bacteria, which makes it easy for the tonsils to become repeatedly infected.  Tonsillitis can be spread from person to person (is contagious). It may be spread by inhaling droplets that are released with coughing or sneezing. You may also come into contact with viruses or bacteria on surfaces, such as cups or utensils.  What are the signs or symptoms?  Symptoms of this condition include:  · A sore throat. This may include trouble swallowing.  · White patches on the tonsils.  · Swollen tonsils.  · Fever.  · Headache.  · Tiredness.  · Loss of appetite.  · Snoring during sleep when you did not snore before.  · Small, foul-smelling, yellowish-white pieces of material (tonsilloliths) that you occasionally cough up or spit out. These can cause you to have bad breath.  How is this diagnosed?  This condition is diagnosed with a physical exam. Diagnosis can be confirmed with the results of lab tests, including a throat culture.  How is this treated?  Treatment for this condition depends on the cause, but usually focuses on treating the symptoms associated with it. Treatment may include:  · Medicines to relieve pain and manage fever.  · Steroid medicines to reduce swelling.  · Antibiotic medicines if the condition is caused by bacteria.  If attacks of tonsillitis are severe and frequent, your health care provider may recommend surgery to remove the tonsils (tonsillectomy).  Follow these instructions at home:  Medicines  · Take over-the-counter  and prescription medicines only as told by your health care provider.  · If you were prescribed an antibiotic medicine, take it as told by your health care provider. Do not stop taking the antibiotic even if you start to feel better.  Eating and drinking  · Drink enough fluid to keep your urine clear or pale yellow.  · While your throat is sore, eat soft or liquid foods, such as sherbet, soups, or instant breakfast drinks.  · Drink warm liquids.  · Eat frozen ice pops.  General instructions  · Rest as much as possible and get plenty of sleep.  · Gargle with a salt-water mixture 3-4 times a day or as needed. To make a salt-water mixture, completely dissolve ½-1 tsp of salt in 1 cup of warm water.  · Wash your hands regularly with soap and water. If soap and water are not available, use hand sanitizer.  · Do not share cups, bottles, or other utensils until your symptoms have gone away.  · Do not smoke. This can help your symptoms and prevent the infection from coming back. If you need help quitting, ask your health care provider.  · Keep all follow-up visits as told by your health care provider. This is important.  Contact a health care provider if:  · You notice large, tender lumps in your neck that were not there before.  · You have a fever that does not go away after 2-3 days.  · You develop a rash.  · You cough up a green, yellow-brown,   or bloody substance.  · You cannot swallow liquids or food for 24 hours.  · Only one of your tonsils is swollen.  Get help right away if:  · You develop any new symptoms, such as vomiting, severe headache, stiff neck, chest pain, trouble breathing, or trouble swallowing.  · You have severe throat pain along with drooling or voice changes.  · You have severe pain that is not controlled with medicines.  · You cannot fully open your mouth.  · You develop redness, swelling, or severe pain anywhere in your neck.  Summary  · Tonsillitis is an infection of the throat that causes the  tonsils to become red, tender, and swollen.  · Tonsillitis may be caused by a virus or bacteria.  · Rest as much as possible. Get plenty of sleep.  This information is not intended to replace advice given to you by your health care provider. Make sure you discuss any questions you have with your health care provider.  Document Released: 01/11/2005 Document Revised: 05/09/2016 Document Reviewed: 05/09/2016  Elsevier Interactive Patient Education © 2019 Elsevier Inc[employer]

## 2018-04-15 NOTE — Progress Notes (Signed)
Subjective:    Patient ID: Marc Lee, male    DOB: 07-31-1985, 32 y.o.   MRN: [id number]  HPI Pt is a 32 yo male who presents to the clinic with right ear pain, congestion, ST, cough for last 3 days. His son was dx with RSV yesterday. His wife is also sick with similar symptoms. He has a hx of ear infections as a child and hearing loss in both ears. No fever, chills, body aches. He has been taking tylenol and theraflu. He did notice some drainage out of right ear for about 1 week before more severe symptoms.   .. Active Ambulatory Problems    Diagnosis Date Noted  . Allergic rhinitis 09/19/2012  . Well adult 09/24/2015  . Dermatitis 09/24/2015  . Tobacco dipper 09/24/2015  . Hyperlipidemia 09/27/2015  . Low back pain 12/24/2015  . Acute bronchitis 12/24/2015  . Dysfunction of both eustachian tubes 06/15/2016  . Left wrist injury 11/21/2017   Resolved Ambulatory Problems    Diagnosis Date Noted  . No Resolved Ambulatory Problems   Past Medical History:  Diagnosis Date  . Multiple allergies       Review of Systems See HPI.     Objective:   Physical Exam Vitals signs reviewed.  Constitutional:      Appearance: He is well-developed.  HENT:     Head: Normocephalic and atraumatic.     Right Ear: Drainage present. No swelling or tenderness. A middle ear effusion is present. Tympanic membrane is erythematous.     Left Ear: Tympanic membrane and ear canal normal. No drainage.     Ears:      Nose: Congestion present.     Mouth/Throat:     Mouth: Mucous membranes are moist.     Pharynx: Uvula midline. Posterior oropharyngeal erythema present. No oropharyngeal exudate or uvula swelling.     Tonsils: No tonsillar exudate or tonsillar abscesses. Swelling: 2+ on the right. 2+ on the left.  Eyes:     Conjunctiva/sclera: Conjunctivae normal.  Cardiovascular:     Rate and Rhythm: Normal rate and regular rhythm.     Heart sounds: Normal heart sounds.  Neurological:   General: No focal deficit present.     Mental Status: He is alert and oriented to person, place, and time.  Psychiatric:        Mood and Affect: Mood normal.        Behavior: Behavior normal.           Assessment & Plan:  .Marland Kitchen.Marc Lee was seen today for sore throat.  Diagnoses and all orders for this visit:  Right otitis media, unspecified otitis media type -     cefdinir (OMNICEF) 300 MG capsule; Take 1 capsule (300 mg total) by mouth 2 (two) times daily.  Tonsillitis -     cefdinir (OMNICEF) 300 MG capsule; Take 1 capsule (300 mg total) by mouth 2 (two) times daily. -     predniSONE (DELTASONE) 50 MG tablet; One tab PO daily for 5 days.  Tympanic membrane perforation, right -     cefdinir (OMNICEF) 300 MG capsule; Take 1 capsule (300 mg total) by mouth 2 (two) times daily.   Follow up in 2 weeks for TM perforation. Do not submerge head in water. Follow up with PCP in 2 weeks to recheck right ear. Concern this perforation could have happened a few weeks ago. Sent omnicef due to PCN intolerance and prednisone for bilateral tonsil swelling. flonase at home could help  drain ETD as well. HO given.

## 2018-04-16 ENCOUNTER — Encounter: Payer: Self-pay | Admitting: Physician Assistant

## 2018-04-22 ENCOUNTER — Encounter: Payer: Self-pay | Admitting: Physician Assistant

## 2018-04-22 ENCOUNTER — Other Ambulatory Visit: Payer: Self-pay | Admitting: Physician Assistant

## 2018-04-22 MED ORDER — AZITHROMYCIN 250 MG PO TABS: ORAL_TABLET | ORAL | 0 refills | Status: DC

## 2018-04-22 MED ORDER — BENZONATATE 200 MG PO CAPS: 200.0000 mg | ORAL_CAPSULE | Freq: Two times a day (BID) | ORAL | 0 refills | Status: DC | PRN

## 2018-05-03 ENCOUNTER — Encounter: Payer: Self-pay | Admitting: Physician Assistant

## 2018-05-03 ENCOUNTER — Ambulatory Visit: Payer: BLUE CROSS/BLUE SHIELD | Admitting: Physician Assistant

## 2018-05-03 VITALS — BP 130/82 | HR 73 | Temp 97.9°F | Ht 73.0 in | Wt 248.0 lb

## 2018-05-03 DIAGNOSIS — J3489 Other specified disorders of nose and nasal sinuses: Secondary | ICD-10-CM

## 2018-05-03 DIAGNOSIS — H7291 Unspecified perforation of tympanic membrane, right ear: Secondary | ICD-10-CM

## 2018-05-03 DIAGNOSIS — H9190 Unspecified hearing loss, unspecified ear: Secondary | ICD-10-CM

## 2018-05-03 DIAGNOSIS — H1013 Acute atopic conjunctivitis, bilateral: Secondary | ICD-10-CM

## 2018-05-03 DIAGNOSIS — H6983 Other specified disorders of Eustachian tube, bilateral: Secondary | ICD-10-CM

## 2018-05-03 MED ORDER — MONTELUKAST SODIUM 10 MG PO TABS: 10.0000 mg | ORAL_TABLET | Freq: Every day | ORAL | 3 refills | Status: DC

## 2018-05-03 MED ORDER — AZELASTINE HCL 0.05 % OP SOLN: 1.0000 [drp] | Freq: Two times a day (BID) | OPHTHALMIC | 5 refills | Status: DC

## 2018-05-03 MED ORDER — PREDNISONE 20 MG PO TABS: ORAL_TABLET | ORAL | 0 refills | Status: DC

## 2018-05-03 NOTE — Patient Instructions (Signed)
Added singulair to zyrtec.  Added sudafed.  Continue flonase.  optivar for eyes.  Prednisone to cut back longer taper.   Referral to ENT.

## 2018-05-03 NOTE — Progress Notes (Signed)
Subjective:    Patient ID: Marc Lee, male    DOB: February 06, 1986, 33 y.o.   MRN: [id number]  HPI: This is a 33 year old male who presents today for a two week follow up for a right TM perforation and right sided otitis media. He states that he is still having some decreased hearing, sinus pressure, sore throat, and ear pain. He states that these symptoms have been intermittent. He is also complaining of red, itchy eyes today as well which began 2 days ago. He was tested for allergies as a child and was found to have many allergies. He has been using Mucinex and flonase for symtpomatic relief. He takes zyrtec daily.   .. Active Ambulatory Problems    Diagnosis Date Noted  . Allergic rhinitis 09/19/2012  . Well adult 09/24/2015  . Dermatitis 09/24/2015  . Tobacco dipper 09/24/2015  . Hyperlipidemia 09/27/2015  . Low back pain 12/24/2015  . Acute bronchitis 12/24/2015  . Dysfunction of both eustachian tubes 06/15/2016  . Left wrist injury 11/21/2017   Resolved Ambulatory Problems    Diagnosis Date Noted  . No Resolved Ambulatory Problems   Past Medical History:  Diagnosis Date  . Multiple allergies        Review of Systems  Constitutional: Negative for activity change, chills, fatigue and fever.  HENT: Positive for ear pain, hearing loss, sinus pressure, sinus pain and sore throat. Negative for ear discharge and tinnitus.   Eyes: Positive for redness and itching. Negative for pain and discharge.  Respiratory: Negative for apnea, chest tightness and wheezing.   Cardiovascular: Negative for chest pain and palpitations.  Skin: Negative for pallor and rash.       Objective:   Physical Exam Constitutional:      General: He is not in acute distress. HENT:     Head: Normocephalic and atraumatic.     Right Ear: Hearing, ear canal and external ear normal. No decreased hearing noted. There is no impacted cerumen. Tympanic membrane is scarred and perforated.     Left Ear:  Hearing, ear canal and external ear normal. No decreased hearing noted. There is no impacted cerumen. Tympanic membrane is retracted.     Ears:     Weber exam findings: does not lateralize.    Right Rinne: AC > BC.    Left Rinne: AC > BC.     Nose: Rhinorrhea present. No congestion.     Mouth/Throat:     Mouth: Mucous membranes are moist.     Pharynx: No oropharyngeal exudate or posterior oropharyngeal erythema.  Eyes:     General: Lids are normal. Vision grossly intact. No scleral icterus.       Right eye: No discharge.        Left eye: No discharge.     Extraocular Movements: Extraocular movements intact.     Conjunctiva/sclera:     Right eye: Right conjunctiva is injected.     Left eye: Left conjunctiva is injected.     Pupils: Pupils are equal, round, and reactive to light.  Neck:     Musculoskeletal: Normal range of motion and neck supple. No muscular tenderness.  Cardiovascular:     Rate and Rhythm: Normal rate.     Pulses: Normal pulses.     Heart sounds: Normal heart sounds.  Pulmonary:     Effort: Pulmonary effort is normal. No respiratory distress.     Breath sounds: Normal breath sounds. No stridor. No wheezing, rhonchi or rales.  Abdominal:     General: Abdomen is flat. Bowel sounds are normal.  Lymphadenopathy:     Cervical: No cervical adenopathy.  Skin:    General: Skin is warm and dry.     Capillary Refill: Capillary refill takes less than 2 seconds.     Coloration: Skin is not jaundiced.     Findings: No bruising.  Neurological:     Mental Status: He is alert.     Coordination: Coordination normal.     Gait: Gait normal.  Psychiatric:        Mood and Affect: Mood normal.        Behavior: Behavior normal.           Assessment & Plan:  .Marland KitchenKyton was seen today for follow-up.  Diagnoses and all orders for this visit:  Perforation of right tympanic membrane -     Ambulatory referral to ENT  Sinus pressure -     Ambulatory referral to ENT -      predniSONE (DELTASONE) 20 MG tablet; Take 3 tablets for 3 days, take 2 tablets for 3 days, take 1 tablet for 3 days, take 1/2 tablet for 4 days. -     montelukast (SINGULAIR) 10 MG tablet; Take 1 tablet (10 mg total) by mouth at bedtime.  Perceived hearing changes -     Ambulatory referral to ENT  Allergic conjunctivitis of both eyes -     predniSONE (DELTASONE) 20 MG tablet; Take 3 tablets for 3 days, take 2 tablets for 3 days, take 1 tablet for 3 days, take 1/2 tablet for 4 days. -     azelastine (OPTIVAR) 0.05 % ophthalmic solution; Place 1 drop into both eyes 2 (two) times daily. -     montelukast (SINGULAIR) 10 MG tablet; Take 1 tablet (10 mg total) by mouth at bedtime.  ETD (Eustachian tube dysfunction), bilateral -     predniSONE (DELTASONE) 20 MG tablet; Take 3 tablets for 3 days, take 2 tablets for 3 days, take 1 tablet for 3 days, take 1/2 tablet for 4 days. -     montelukast (SINGULAIR) 10 MG tablet; Take 1 tablet (10 mg total) by mouth at bedtime.   no improvement seen from 2 weeks with TM perforation -referral to ENT placed -Do not submerge head in water  Sinus pressure -Continue Flonase  Perceived hearing changes: Weber and Rinne as well as gross hearing all appeared normal at today's visit.  -Referral to ENT placed   Allergic conjunctivitis of both eyes -Singulair 10mg  PO QD -OTC Sudafed -Continue Flonase, Mucinex for symptomatic relief -consider referral to allergist if symptoms are not improving. May benefit from allergy shots.   Marland KitchenHarlon Flor PA-C, have reviewed and agree with the above documentation in it's entirety.

## 2018-05-16 ENCOUNTER — Encounter: Payer: Self-pay | Admitting: Physician Assistant

## 2018-05-23 ENCOUNTER — Ambulatory Visit: Payer: BLUE CROSS/BLUE SHIELD | Admitting: Family Medicine

## 2018-05-24 ENCOUNTER — Encounter: Payer: Self-pay | Admitting: Family Medicine

## 2018-05-24 ENCOUNTER — Ambulatory Visit (INDEPENDENT_AMBULATORY_CARE_PROVIDER_SITE_OTHER): Payer: BLUE CROSS/BLUE SHIELD | Admitting: Family Medicine

## 2018-05-24 VITALS — BP 128/67 | HR 91 | Temp 98.3°F | Ht 73.0 in | Wt 249.0 lb

## 2018-05-24 DIAGNOSIS — J3489 Other specified disorders of nose and nasal sinuses: Secondary | ICD-10-CM | POA: Diagnosis not present

## 2018-05-24 DIAGNOSIS — H7291 Unspecified perforation of tympanic membrane, right ear: Secondary | ICD-10-CM

## 2018-05-24 DIAGNOSIS — J019 Acute sinusitis, unspecified: Secondary | ICD-10-CM | POA: Diagnosis not present

## 2018-05-24 MED ORDER — CEFUROXIME AXETIL 500 MG PO TABS: 500.0000 mg | ORAL_TABLET | Freq: Two times a day (BID) | ORAL | 0 refills | Status: DC

## 2018-05-24 MED ORDER — BENZONATATE 200 MG PO CAPS: 200.0000 mg | ORAL_CAPSULE | Freq: Two times a day (BID) | ORAL | 0 refills | Status: DC | PRN

## 2018-05-24 MED ORDER — HYDROCODONE-HOMATROPINE 5-1.5 MG/5ML PO SYRP: 5.0000 mL | ORAL_SOLUTION | Freq: Every evening | ORAL | 0 refills | Status: DC | PRN

## 2018-05-24 NOTE — Progress Notes (Signed)
Acute Office Visit  Subjective:    Patient ID: Marc Lee, male    DOB: 12/27/1985, 33 y.o.   MRN: [id number]  Chief Complaint  Patient presents with  . Sinusitis    nasal congestion. he was seen 3 wks ago for this and reports that it has not gotten any better  . Cough    pt reports that he has been taking mucinex. this is worse at night,denies f/s/c    HPI Patient is in today for nasal congestion.  He says he was actually seen 3 weeks ago for similar symptoms but reports he has not improved.  At the time his cough was just more mild.  He was treated with prednisone which caused severe constipation as well as Singulair.  Unfortunately his cough has persisted and gotten worse to the point that he is gagging.  It is keeping him up all night.  He is getting some phlegm up.  He feels like he is getting some facial flushing and pain and just feels like his face is really hot..  It is worse at night but denies any fever sweats or chills he is been using Mucinex.  Past Medical History:  Diagnosis Date  . Multiple allergies     Past Surgical History:  Procedure Laterality Date  . TYMPANOSTOMY      No family history on file.  Social History   Socioeconomic History  . Marital status: Married    Spouse name: Not on file  . Number of children: Not on file  . Years of education: Not on file  . Highest education level: Not on file  Occupational History  . Not on file  Social Needs  . Financial resource strain: Not on file  . Food insecurity:    Worry: Not on file    Inability: Not on file  . Transportation needs:    Medical: Not on file    Non-medical: Not on file  Tobacco Use  . Smoking status: Never Smoker  . Smokeless tobacco: Current User  Substance and Sexual Activity  . Alcohol use: Yes  . Drug use: No  . Sexual activity: Not on file  Lifestyle  . Physical activity:    Days per week: Not on file    Minutes per session: Not on file  . Stress: Not on file   Relationships  . Social connections:    Talks on phone: Not on file    Gets together: Not on file    Attends religious service: Not on file    Active member of club or organization: Not on file    Attends meetings of clubs or organizations: Not on file    Relationship status: Not on file  . Intimate partner violence:    Fear of current or ex partner: Not on file    Emotionally abused: Not on file    Physically abused: Not on file    Forced sexual activity: Not on file  Other Topics Concern  . Not on file  Social History Narrative  . Not on file    Outpatient Medications Prior to Visit  Medication Sig Dispense Refill  . cetirizine (ZYRTEC) 10 MG tablet Take 10 mg by mouth daily.    . montelukast (SINGULAIR) 10 MG tablet Take 1 tablet (10 mg total) by mouth at bedtime. 90 tablet 3  . meloxicam (MOBIC) 15 MG tablet Take 1 tablet (15 mg total) by mouth daily with breakfast. (Patient not taking: Reported on 05/24/2018) 90  tablet 3  . azelastine (OPTIVAR) 0.05 % ophthalmic solution Place 1 drop into both eyes 2 (two) times daily. 6 mL 5  . predniSONE (DELTASONE) 20 MG tablet Take 3 tablets for 3 days, take 2 tablets for 3 days, take 1 tablet for 3 days, take 1/2 tablet for 4 days. 21 tablet 0   No facility-administered medications prior to visit.     Allergies  Allergen Reactions  . Amoxicillin-Pot Clavulanate Other (See Comments) and Nausea And Vomiting    vomiting    ROS     Objective:    Physical Exam  Constitutional: He is oriented to person, place, and time. He appears well-developed and well-nourished.  HENT:  Head: Normocephalic and atraumatic.  Right Ear: External ear normal.  Left Ear: External ear normal.  Nose: Nose normal.  Mouth/Throat: Oropharynx is clear and moist.  TMS are retracted with scar tissue and the right looks like a performation.   Eyes: Pupils are equal, round, and reactive to light. Conjunctivae and EOM are normal.  Neck: Neck supple. No  thyromegaly present.  Cardiovascular: Normal rate and normal heart sounds.  Pulmonary/Chest: Effort normal and breath sounds normal.  Lymphadenopathy:    He has no cervical adenopathy.  Neurological: He is alert and oriented to person, place, and time.  Skin: Skin is warm and dry.  Psychiatric: He has a normal mood and affect.    BP 128/67   Pulse 91   Temp 98.3 F (36.8 C)   Ht 6\' 1"  (1.854 m)   Wt 249 lb (112.9 kg)   SpO2 99%   BMI 32.85 kg/m  Wt Readings from Last 3 Encounters:  05/24/18 249 lb (112.9 kg)  05/03/18 248 lb (112.5 kg)  04/15/18 245 lb (111.1 kg)    There are no preventive care reminders to display for this patient.  There are no preventive care reminders to display for this patient.   Lab Results  Component Value Date   TSH 0.81 09/24/2015   Lab Results  Component Value Date   WBC 8.1 09/24/2015   HGB 15.9 09/24/2015   HCT 46.3 09/24/2015   MCV 81.9 09/24/2015   PLT 202 09/24/2015   Lab Results  Component Value Date   NA 138 09/24/2015   K 4.7 09/24/2015   CO2 25 09/24/2015   GLUCOSE 92 09/24/2015   BUN 10 09/24/2015   CREATININE 0.93 09/24/2015   BILITOT 0.6 09/24/2015   ALKPHOS 70 09/24/2015   AST 22 09/24/2015   ALT 32 09/24/2015   PROT 6.8 09/24/2015   ALBUMIN 4.3 09/24/2015   CALCIUM 9.4 09/24/2015   Lab Results  Component Value Date   CHOL 220 (H) 09/24/2015   Lab Results  Component Value Date   HDL 31 (L) 09/24/2015   Lab Results  Component Value Date   LDLCALC 126 09/24/2015   Lab Results  Component Value Date   TRIG 315 (H) 09/24/2015   Lab Results  Component Value Date   CHOLHDL 7.1 (H) 09/24/2015   Lab Results  Component Value Date   HGBA1C 5.5 09/24/2015       Assessment & Plan:   Problem List Items Addressed This Visit    None    Visit Diagnoses    Acute non-recurrent sinusitis, unspecified location    -  Primary   Relevant Medications   cefUROXime (CEFTIN) 500 MG tablet   benzonatate  (TESSALON) 200 MG capsule   HYDROcodone-homatropine (HYCODAN) 5-1.5 MG/5ML syrup   Sinus pressure  Perforation of right tympanic membrane         Sinusitis-we will treat with cefuroxime based on allergies.  Also given prescription for Tessalon Perles to use during the day for cough as well as hydrocodone at bedtime only warned about potential for sedation.  Perforated tympanic membrane-trying to refer her to [facility].  He reports they have not contacted him and we placed the referral missed 3 weeks ago.  Will call their office to see if they have scheduled him an appointment or if they are having difficulty getting in touch with him.   Meds ordered this encounter  Medications  . cefUROXime (CEFTIN) 500 MG tablet    Sig: Take 1 tablet (500 mg total) by mouth 2 (two) times daily with a meal.    Dispense:  20 tablet    Refill:  0  . benzonatate (TESSALON) 200 MG capsule    Sig: Take 1 capsule (200 mg total) by mouth 2 (two) times daily as needed for cough.    Dispense:  20 capsule    Refill:  0  . HYDROcodone-homatropine (HYCODAN) 5-1.5 MG/5ML syrup    Sig: Take 5 mLs by mouth at bedtime as needed for cough.    Dispense:  120 mL    Refill:  0     Nani Gasser, MD

## 2018-06-14 DIAGNOSIS — M5416 Radiculopathy, lumbar region: Secondary | ICD-10-CM | POA: Diagnosis not present

## 2018-06-14 DIAGNOSIS — M9905 Segmental and somatic dysfunction of pelvic region: Secondary | ICD-10-CM | POA: Diagnosis not present

## 2018-06-14 DIAGNOSIS — M9903 Segmental and somatic dysfunction of lumbar region: Secondary | ICD-10-CM | POA: Diagnosis not present

## 2018-06-14 DIAGNOSIS — M25551 Pain in right hip: Secondary | ICD-10-CM | POA: Diagnosis not present

## 2018-06-17 DIAGNOSIS — M9905 Segmental and somatic dysfunction of pelvic region: Secondary | ICD-10-CM | POA: Diagnosis not present

## 2018-06-17 DIAGNOSIS — M9903 Segmental and somatic dysfunction of lumbar region: Secondary | ICD-10-CM | POA: Diagnosis not present

## 2018-06-17 DIAGNOSIS — M25551 Pain in right hip: Secondary | ICD-10-CM | POA: Diagnosis not present

## 2018-06-17 DIAGNOSIS — M5416 Radiculopathy, lumbar region: Secondary | ICD-10-CM | POA: Diagnosis not present

## 2018-07-12 DIAGNOSIS — H7291 Unspecified perforation of tympanic membrane, right ear: Secondary | ICD-10-CM | POA: Diagnosis not present

## 2018-07-12 DIAGNOSIS — H9011 Conductive hearing loss, unilateral, right ear, with unrestricted hearing on the contralateral side: Secondary | ICD-10-CM | POA: Diagnosis not present

## 2018-07-12 DIAGNOSIS — H6983 Other specified disorders of Eustachian tube, bilateral: Secondary | ICD-10-CM | POA: Diagnosis not present

## 2019-01-10 DIAGNOSIS — H16143 Punctate keratitis, bilateral: Secondary | ICD-10-CM | POA: Diagnosis not present

## 2019-03-07 DIAGNOSIS — H7291 Unspecified perforation of tympanic membrane, right ear: Secondary | ICD-10-CM | POA: Diagnosis not present

## 2019-03-07 DIAGNOSIS — H6691 Otitis media, unspecified, right ear: Secondary | ICD-10-CM | POA: Diagnosis not present

## 2019-03-07 DIAGNOSIS — H6983 Other specified disorders of Eustachian tube, bilateral: Secondary | ICD-10-CM | POA: Diagnosis not present

## 2019-03-28 DIAGNOSIS — H906 Mixed conductive and sensorineural hearing loss, bilateral: Secondary | ICD-10-CM | POA: Diagnosis not present

## 2019-03-28 DIAGNOSIS — H7291 Unspecified perforation of tympanic membrane, right ear: Secondary | ICD-10-CM | POA: Diagnosis not present

## 2019-03-28 DIAGNOSIS — H6983 Other specified disorders of Eustachian tube, bilateral: Secondary | ICD-10-CM | POA: Diagnosis not present

## 2019-05-24 ENCOUNTER — Ambulatory Visit (INDEPENDENT_AMBULATORY_CARE_PROVIDER_SITE_OTHER)
Admission: RE | Admit: 2019-05-24 | Discharge: 2019-05-24 | Disposition: A | Discharge status: Home / Self Care | Payer: BC Managed Care – PPO | Source: Ambulatory Visit

## 2019-05-24 ENCOUNTER — Other Ambulatory Visit: Payer: Self-pay

## 2019-05-24 DIAGNOSIS — J029 Acute pharyngitis, unspecified: Secondary | ICD-10-CM | POA: Diagnosis not present

## 2019-05-24 MED ORDER — CEFUROXIME AXETIL 500 MG PO TABS: 500.0000 mg | ORAL_TABLET | Freq: Two times a day (BID) | ORAL | 0 refills | Status: AC

## 2019-05-24 NOTE — Discharge Instructions (Addendum)
You likely have strep throat. You may use tylenol or ibuprofen as needed for pain, HA, earache, fever.  I have sent in an antibiotic for you to take twice a day for 10 days.   Report to the Emergency Department with shortness of breath, high fever, severe diarrhea, or other concerning symptoms.

## 2019-05-24 NOTE — ED Provider Notes (Signed)
[facility] VIDEO VISITS    CSN: [id number] Arrival date & time: 05/24/19  1007      History   Chief Complaint No chief complaint on file.   HPI Marc Lee is a 34 y.o. male.   Reports ST x 2 days with headache and bilateral earache. Reports that his wife and 2 children have strep throat, as well as his mother-in-law. Reports that he has had strep before and these are very similar symptoms. Denies fever, chills, SOB, chest pain, n/v/d. Has used tylenol and ibuprofen with relief for sore throat. Has also been using cough drops.   The history is provided by the patient.    Past Medical History:  Diagnosis Date  . Multiple allergies     Patient Active Problem List   Diagnosis Date Noted  . Left wrist injury 11/21/2017  . Dysfunction of both eustachian tubes 06/15/2016  . Low back pain 12/24/2015  . Hyperlipidemia 09/27/2015  . Well adult 09/24/2015  . Dermatitis 09/24/2015  . Tobacco dipper 09/24/2015  . Allergic rhinitis 09/19/2012    Past Surgical History:  Procedure Laterality Date  . TYMPANOSTOMY         Home Medications    Prior to Admission medications   Medication Sig Start Date End Date Taking? Authorizing Provider  benzonatate (TESSALON) 200 MG capsule Take 1 capsule (200 mg total) by mouth 2 (two) times daily as needed for cough. 05/24/18   Hali Marry, MD  cefUROXime (CEFTIN) 500 MG tablet Take 1 tablet (500 mg total) by mouth 2 (two) times daily with a meal for 10 days. 05/24/19 06/03/19  Faustino Congress, NP  cetirizine (ZYRTEC) 10 MG tablet Take 10 mg by mouth daily.    [provider]  HYDROcodone-homatropine (HYCODAN) 5-1.5 MG/5ML syrup Take 5 mLs by mouth at bedtime as needed for cough. 05/24/18   Hali Marry, MD  meloxicam (MOBIC) 15 MG tablet Take 1 tablet (15 mg total) by mouth daily with breakfast. Patient not taking: Reported on 05/24/2018 11/21/17   Silverio Decamp, MD  montelukast (SINGULAIR) 10 MG tablet Take 1  tablet (10 mg total) by mouth at bedtime. 05/03/18   Donella Stade, PA-C    Family History No family history on file.  Social History Social History   Tobacco Use  . Smoking status: Never Smoker  . Smokeless tobacco: Current User  Substance Use Topics  . Alcohol use: Yes  . Drug use: No     Allergies   Amoxicillin-pot clavulanate   Review of Systems Review of Systems  Constitutional: Negative for chills, diaphoresis, fatigue and fever.  HENT: Positive for ear pain and sore throat. Negative for congestion, sinus pressure and sinus pain.   Eyes: Negative for pain and visual disturbance.  Respiratory: Negative for cough and shortness of breath.   Cardiovascular: Negative for chest pain and palpitations.  Gastrointestinal: Negative for abdominal pain, diarrhea, nausea and vomiting.  Musculoskeletal: Negative for arthralgias, back pain and myalgias.  Skin: Negative for color change and rash.  Neurological: Positive for headaches. Negative for dizziness and light-headedness.  All other systems reviewed and are negative.    Physical Exam Triage Vital Signs ED Triage Vitals  Enc Vitals Group     BP      Pulse      Resp      Temp      Temp src      SpO2      Weight      Height  Head Circumference      Peak Flow      Pain Score      Pain Loc      Pain Edu?      Excl. in GC?    No data found.  Updated Vital Signs There were no vitals taken for this visit.  Visual Acuity Right Eye Distance:   Left Eye Distance:   Bilateral Distance:    Right Eye Near:   Left Eye Near:    Bilateral Near:     Physical Exam Constitutional:      General: He is not in acute distress.    Appearance: He is well-developed.  HENT:     Head: Normocephalic and atraumatic.     Mouth/Throat:     Mouth: Mucous membranes are moist.     Pharynx: Oropharyngeal exudate and posterior oropharyngeal erythema present.     Comments: Eval via video Eyes:     Conjunctiva/sclera:  Conjunctivae normal.  Pulmonary:     Effort: Pulmonary effort is normal.     Breath sounds: Normal breath sounds.  Skin:    General: Skin is warm and dry.  Neurological:     Mental Status: He is alert and oriented to person, place, and time.  Psychiatric:        Mood and Affect: Mood normal.        Behavior: Behavior normal.      UC Treatments / Results  Labs (all labs ordered are listed, but only abnormal results are displayed) Labs Reviewed - No data to display  EKG   Radiology No results found.  Procedures Procedures (including critical care time)  Medications Ordered in UC Medications - No data to display  Initial Impression / Assessment and Plan / UC Course  I have reviewed the triage vital signs and the nursing notes.  Pertinent labs & imaging results that were available during my care of the patient were reviewed by me and considered in my medical decision making (see chart for details).     Presents with acute pharyngitis. Entire household has strep pharyngitis. Will go ahead and treat. Prescribed Ceftin 500mg  BID x 10 days. Instructed to take medications as directed. Instructed to follow up with primary care provider. Instructed on when to go to the Emergency Department. AVS reviewed, questions answered. Final Clinical Impressions(s) / UC Diagnoses   Final diagnoses:  Acute pharyngitis, unspecified etiology     Discharge Instructions     You likely have strep throat. You may use tylenol or ibuprofen as needed for pain, HA, earache, fever.  I have sent in an antibiotic for you to take twice a day for 10 days.   Report to the Emergency Department with shortness of breath, high fever, severe diarrhea, or other concerning symptoms.    ED Prescriptions    Medication Sig Dispense Auth. Provider   cefUROXime (CEFTIN) 500 MG tablet Take 1 tablet (500 mg total) by mouth 2 (two) times daily with a meal for 10 days. 20 tablet , NP     I  have reviewed the PDMP during this encounter.   Moshe Cipro, NP 05/24/19 1037

## 2019-06-27 DIAGNOSIS — H6983 Other specified disorders of Eustachian tube, bilateral: Secondary | ICD-10-CM | POA: Diagnosis not present

## 2019-06-27 DIAGNOSIS — H7291 Unspecified perforation of tympanic membrane, right ear: Secondary | ICD-10-CM | POA: Diagnosis not present

## 2019-06-27 DIAGNOSIS — H7412 Adhesive left middle ear disease: Secondary | ICD-10-CM | POA: Diagnosis not present

## 2019-09-04 ENCOUNTER — Telehealth (INDEPENDENT_AMBULATORY_CARE_PROVIDER_SITE_OTHER): Payer: BC Managed Care – PPO | Admitting: Family Medicine

## 2019-09-04 ENCOUNTER — Encounter: Payer: Self-pay | Admitting: Family Medicine

## 2019-09-04 DIAGNOSIS — J40 Bronchitis, not specified as acute or chronic: Secondary | ICD-10-CM

## 2019-09-04 DIAGNOSIS — J329 Chronic sinusitis, unspecified: Secondary | ICD-10-CM | POA: Diagnosis not present

## 2019-09-04 MED ORDER — CEFDINIR 300 MG PO CAPS: 300.0000 mg | ORAL_CAPSULE | Freq: Two times a day (BID) | ORAL | 0 refills | Status: AC

## 2019-09-04 MED ORDER — HYDROCODONE-HOMATROPINE 5-1.5 MG/5ML PO SYRP: 5.0000 mL | ORAL_SOLUTION | Freq: Every evening | ORAL | 0 refills | Status: DC | PRN

## 2019-09-04 NOTE — Progress Notes (Signed)
Both ears are itchy and draining.  Nose running.  Cough from drainage. Chest congestion x 2 days.

## 2019-09-04 NOTE — Progress Notes (Signed)
Marc Lee - 34 y.o. male MRN [id number]  Date of birth: 1985-07-11   This visit type was conducted due to national recommendations for restrictions regarding the COVID-19 Pandemic (e.g. social distancing).  This format is felt to be most appropriate for this patient at this time.  All issues noted in this document were discussed and addressed.  No physical exam was performed (except for noted visual exam findings with Video Visits).  I discussed the limitations of evaluation and management by telemedicine and the availability of in person appointments. The patient expressed understanding and agreed to proceed.  I connected with@ on 09/04/19 at 11:10 AM EDT by a video enabled telemedicine application and verified that I am speaking with the correct person using two identifiers.  Present at visit: Luetta Nutting, DO Jackqulyn Livings   Patient Location: Home [address] [place] [address]   Provider location:   [facility]  Chief Complaint  Patient presents with  . Nasal Congestion    HPI  Marc Lee is a 34 y.o. male who presents via audio/video conferencing for a telehealth visit today.  He reports 2 days of sinus pain and pressure with productive cough, chest congestion and post nasal drainage.  He reports that he has this annually around this time of year and clears up with antibiotics.  He uses hycodan cough syrup at bedtime as well.  He denies fever, chills, chest tightness, shortness of breath, or body aches.    ROS:  A comprehensive ROS was completed and negative except as noted per HPI  Past Medical History:  Diagnosis Date  . Multiple allergies     Past Surgical History:  Procedure Laterality Date  . TYMPANOSTOMY      No family history on file.  Social History   Socioeconomic History  . Marital status: Married    Spouse name: Not on file  . Number of children: Not on file  . Years of education: Not on file  . Highest education level: Not on file   Occupational History  . Not on file  Tobacco Use  . Smoking status: Never Smoker  . Smokeless tobacco: Current User  Substance and Sexual Activity  . Alcohol use: Yes  . Drug use: No  . Sexual activity: Not on file  Other Topics Concern  . Not on file  Social History Narrative  . Not on file   Social Determinants of Health   Financial Resource Strain:   . Difficulty of Paying Living Expenses:   Food Insecurity:   . Worried About Charity fundraiser in the Last Year:   . Arboriculturist in the Last Year:   Transportation Needs:   . Film/video editor (Medical):   Marland Kitchen Lack of Transportation (Non-Medical):   Physical Activity:   . Days of Exercise per Week:   . Minutes of Exercise per Session:   Stress:   . Feeling of Stress :   Social Connections:   . Frequency of Communication with Friends and Family:   . Frequency of Social Gatherings with Friends and Family:   . Attends Religious Services:   . Active Member of Clubs or Organizations:   . Attends Archivist Meetings:   Marland Kitchen Marital Status:   Intimate Partner Violence:   . Fear of Current or Ex-Partner:   . Emotionally Abused:   Marland Kitchen Physically Abused:   . Sexually Abused:      Current Outpatient Medications:  .  cefdinir (OMNICEF) 300  MG capsule, Take 1 capsule (300 mg total) by mouth 2 (two) times daily for 7 days., Disp: 14 capsule, Rfl: 0 .  cetirizine (ZYRTEC) 10 MG tablet, Take 10 mg by mouth daily., Disp: , Rfl:  .  HYDROcodone-homatropine (HYCODAN) 5-1.5 MG/5ML syrup, Take 5 mLs by mouth at bedtime as needed for cough., Disp: 120 mL, Rfl: 0  EXAM:  VITALS per patient if applicable: Wt 249 lb (112.9 kg)   BMI 32.85 kg/m   GENERAL: alert, oriented, appears well and in no acute distress  HEENT: atraumatic, conjunttiva clear, no obvious abnormalities on inspection of external nose and ears  NECK: normal movements of the head and neck  LUNGS: on inspection no signs of respiratory distress,  breathing rate appears normal, no obvious gross SOB, gasping or wheezing  CV: no obvious cyanosis  MS: moves all visible extremities without noticeable abnormality  PSYCH/NEURO: pleasant and cooperative, no obvious depression or anxiety, speech and thought processing grossly intact  ASSESSMENT AND PLAN:  Discussed the following assessment and plan:  Sinobronchitis Recommend continued supportive care with increased fluids and rest.  Start omnicef 300mg  bid x7 days.  Hycodan syrup sent in to use at night.  If not improving we can add prednisone as well.  He will let know if not improving or developing new or worsening symptoms.   30 minutes spent including pre visit preparation, review of prior notes and labs, encounter with patient via video visit and same day documentation.     I discussed the assessment and treatment plan with the patient. The patient was provided an opportunity to ask questions and all were answered. The patient agreed with the plan and demonstrated an understanding of the instructions.   The patient was advised to call back or seek an in-person evaluation if the symptoms worsen or if the condition fails to improve as anticipated.    [place], DO

## 2019-09-04 NOTE — Assessment & Plan Note (Signed)
Recommend continued supportive care with increased fluids and rest.  Start omnicef 300mg  bid x7 days.  Hycodan syrup sent in to use at night.  If not improving we can add prednisone as well.  He will let know if not improving or developing new or worsening symptoms.

## 2019-09-11 ENCOUNTER — Telehealth: Payer: BC Managed Care – PPO | Admitting: Family Medicine

## 2020-05-31 ENCOUNTER — Telehealth: Payer: BC Managed Care – PPO | Admitting: Physician Assistant

## 2020-05-31 ENCOUNTER — Encounter: Payer: Self-pay | Admitting: Physician Assistant

## 2020-05-31 DIAGNOSIS — B9789 Other viral agents as the cause of diseases classified elsewhere: Secondary | ICD-10-CM | POA: Diagnosis not present

## 2020-05-31 DIAGNOSIS — J019 Acute sinusitis, unspecified: Secondary | ICD-10-CM | POA: Diagnosis not present

## 2020-05-31 MED ORDER — BENZONATATE 100 MG PO CAPS: 100.0000 mg | ORAL_CAPSULE | Freq: Three times a day (TID) | ORAL | 0 refills | Status: DC | PRN

## 2020-05-31 MED ORDER — IPRATROPIUM BROMIDE 0.03 % NA SOLN: 2.0000 | Freq: Two times a day (BID) | NASAL | 0 refills | Status: DC

## 2020-05-31 NOTE — Patient Instructions (Signed)
Instructions sent to patients MyChart.

## 2020-05-31 NOTE — Progress Notes (Signed)
[name], schmieg are scheduled for a virtual visit with your provider today.    Just as we do with appointments in the office, we must obtain your consent to participate.  Your consent will be active for this visit and any virtual visit you may have with one of our providers in the next 365 days.    If you have a MyChart account, I can also send a copy of this consent to you electronically.  All virtual visits are billed to your insurance company just like a traditional visit in the office.  As this is a virtual visit, video technology does not allow for your provider to perform a traditional examination.  This may limit your provider's ability to fully assess your condition.  If your provider identifies any concerns that need to be evaluated in person or the need to arrange testing such as labs, EKG, etc, we will make arrangements to do so.    Although advances in technology are sophisticated, we cannot ensure that it will always work on either your end or our end.  If the connection with a video visit is poor, we may have to switch to a telephone visit.  With either a video or telephone visit, we are not always able to ensure that we have a secure connection.   I need to obtain your verbal consent now.   Are you willing to proceed with your visit today?   Samvel Zinn has provided verbal consent on 05/31/2020 for a virtual visit (video or telephone).   Piedad Climes, PA-C 05/31/2020  1:32 PM     Virtual Visit via Video   I connected with patient on 05/31/20 at  1:45 PM EST by a video enabled telemedicine application and verified that I am speaking with the correct person using two identifiers.  Location patient: Home Location provider: [facility] Persons participating in the virtual visit: Patient, Provider  I discussed the limitations of evaluation and management by telemedicine and the availability of in person appointments. The patient expressed understanding and  agreed to proceed.  Subjective:   HPI:   Patient presents via [facility] today complaining of 2-1/2 days of nasal drainage with AM scratchy throat, dry cough and bilateral ear pressure.  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  Denies any recent travel or sick contact.  Denies any chest congestion or shortness of breath.  Denies fever, chills or aches.  Notes cough is worse at night and is keeping him awake.  Does currently have a right tympanic membrane rupture, has upcoming tympanoplasty.  Has been taking Tylenol Sinus and NyQuil with some improvement in symptoms.  Does have chronic rhinitis for which he takes Zyrtec daily.   ROS:   See pertinent positives and negatives per HPI.  Patient Active Problem List   Diagnosis Date Noted  . Sinobronchitis 09/04/2019  . Left wrist injury 11/21/2017  . Dysfunction of both eustachian tubes 06/15/2016  . Low back pain 12/24/2015  . Hyperlipidemia 09/27/2015  . Well adult 09/24/2015  . Dermatitis 09/24/2015  . Tobacco dipper 09/24/2015  . Allergic rhinitis 09/19/2012    Social History   Tobacco Use  . Smoking status: Never Smoker  . Smokeless tobacco: Current User  Substance Use Topics  . Alcohol use: Yes    Current Outpatient Medications:  .  cetirizine (ZYRTEC) 10 MG tablet, Take 10 mg by mouth daily., Disp: , Rfl:  .  HYDROcodone-homatropine (HYCODAN) 5-1.5 MG/5ML syrup, Take 5 mLs by mouth at bedtime as needed for cough., Disp: 120 mL, Rfl: 0  Allergies  Allergen Reactions  . Amoxicillin-Pot Clavulanate Other (See Comments) and Nausea And Vomiting    vomiting    Objective:   There were no vitals taken for this visit.  Patient is well-developed, well-nourished in no acute distress.  Resting comfortably at home.  Head is normocephalic, atraumatic.  No labored breathing.  Speech is clear and coherent with logical content.  Patient is alert and oriented at  baseline.   Assessment and Plan:   1. Acute viral sinusitis Does not seem Covid related.  No indication for testing at present.  With chronic rhinitis and eustachian tube dysfunction.  Increase fluids.  Start saline nasal rinse.  Rx ipratropium nasal spray.  Okay to continue Tylenol Sinus and NyQuil.  We will add on Tessalon for cough.  Continue Zyrtec.  Strict follow-up precautions discussed with patient who voiced understanding and agreement with the plan. - ipratropium (ATROVENT) 0.03 % nasal spray; Place 2 sprays into both nostrils every 12 (twelve) hours.  Dispense: 30 mL; Refill: 0 - benzonatate (TESSALON) 100 MG capsule; Take 1 capsule (100 mg total) by mouth 3 (three) times daily as needed for cough.  Dispense: 30 capsule; Refill: 0,ed.   Piedad Climes, [place] 05/31/2020

## 2020-06-04 ENCOUNTER — Encounter: Payer: Self-pay | Admitting: Family Medicine

## 2020-06-04 ENCOUNTER — Telehealth (INDEPENDENT_AMBULATORY_CARE_PROVIDER_SITE_OTHER): Payer: BC Managed Care – PPO | Admitting: Family Medicine

## 2020-06-04 VITALS — Temp 98.0°F | Ht 73.0 in

## 2020-06-04 DIAGNOSIS — R059 Cough, unspecified: Secondary | ICD-10-CM

## 2020-06-04 DIAGNOSIS — H9203 Otalgia, bilateral: Secondary | ICD-10-CM

## 2020-06-04 DIAGNOSIS — J019 Acute sinusitis, unspecified: Secondary | ICD-10-CM | POA: Diagnosis not present

## 2020-06-04 MED ORDER — HYDROCODONE-HOMATROPINE 5-1.5 MG/5ML PO SYRP: 5.0000 mL | ORAL_SOLUTION | Freq: Every evening | ORAL | 0 refills | Status: DC | PRN

## 2020-06-04 MED ORDER — AMOXICILLIN 875 MG PO TABS: 875.0000 mg | ORAL_TABLET | Freq: Two times a day (BID) | ORAL | 0 refills | Status: DC

## 2020-06-04 NOTE — Progress Notes (Signed)
Virtual Visit via Video Note  I connected with Marc Lee on 06/04/20 at  2:40 PM EST by a video enabled telemedicine application and verified that I am speaking with the correct person using two identifiers.   I discussed the limitations of evaluation and management by telemedicine and the availability of in person appointments. The patient expressed understanding and agreed to proceed.  Patient location: Provider location: in office  Subjective:    CC: Cough  HPI: Spoke w/pt he reports that sxs began 7 days ago. He has tried Mucinex,delsyum dm. He did a virtual visit on 05/31/2020 and was given Tessalon and Ipratropium nasal spray. Drinking plenty of water.   He stated that his ears stopped up first and then the drainage went into his throat. He will cough up some green mucus sometimes. 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  No drainage.   Denies f/s/c/n/v/d/body aches or headaches.   He denies any sick contacts.   Past medical history, Surgical history, Family history not pertinant except as noted below, Social history, Allergies, and medications have been entered into the medical record, reviewed, and corrections made.   Review of Systems: No fevers, chills, night sweats, weight loss, chest pain, or shortness of breath.   Objective:    General: Speaking clearly in complete sentences without any shortness of breath.  Alert and oriented x3.  Normal judgment. No apparent acute distress.    Impression and Recommendations:    No problem-specific Assessment & Plan notes found for this encounter.  Acute sinusitis - will tx with amox to cover for ear as well.  He is very worried about his right ear since he has a perforated TM and has not had a chance to have it surgically corrected.  He has not had any drainage in about ear which is reassuring.  But it is been 7 days and he is not feeling any better.  He would like something stronger for cough at night.  Hycodan cough  syrup sent to pharmacy but did warn him that most pharmacies are out of stock.  Call if not improving over the next week.  Did highly encourage him to consider testing for Covid.  His wife is also sick with similar symptoms and now his son is sick in fact he just got a call from daycare to come get him and he is running a fever.    Time spent in encounter 20 minutes  I discussed the assessment and treatment plan with the patient. The patient was provided an opportunity to ask questions and all were answered. The patient agreed with the plan and demonstrated an understanding of the instructions.   The patient was advised to call back or seek an in-person evaluation if the symptoms worsen or if the condition fails to improve as anticipated.   Nani Gasser, MD

## 2020-06-04 NOTE — Progress Notes (Signed)
Spoke w/pt he reports that sxs began 5 days ago. He has tried Mucinex,delsyum dm. He did a virtual visit on 05/31/2020 and was given Tessalon and Ipratropium nasal spray.   He stated that his ears stopped up first and then the drainage went into his throat. He will cough up some green mucus sometimes.   Denies f/s/c/n/v/d/body aches or headaches.   He denies any sick contacts.

## 2020-06-08 ENCOUNTER — Telehealth: Payer: Self-pay | Admitting: Family Medicine

## 2020-06-20 ENCOUNTER — Emergency Department: Admit: 2020-06-20 | Discharge status: Absent without leave | Payer: Self-pay

## 2020-06-20 ENCOUNTER — Other Ambulatory Visit: Payer: Self-pay

## 2020-06-20 ENCOUNTER — Emergency Department (INDEPENDENT_AMBULATORY_CARE_PROVIDER_SITE_OTHER)
Admission: EM | Admit: 2020-06-20 | Discharge: 2020-06-20 | Disposition: A | Discharge status: Home / Self Care | Payer: BC Managed Care – PPO | Source: Home / Self Care

## 2020-06-20 ENCOUNTER — Encounter: Payer: Self-pay | Admitting: Emergency Medicine

## 2020-06-20 DIAGNOSIS — J209 Acute bronchitis, unspecified: Secondary | ICD-10-CM | POA: Diagnosis not present

## 2020-06-20 MED ORDER — PREDNISONE 10 MG (21) PO TBPK: ORAL_TABLET | Freq: Every day | ORAL | 0 refills | Status: AC

## 2020-06-20 MED ORDER — PREDNISONE 10 MG (21) PO TBPK: ORAL_TABLET | Freq: Every day | ORAL | 0 refills | Status: DC

## 2020-06-20 MED ORDER — DEXAMETHASONE SODIUM PHOSPHATE 10 MG/ML IJ SOLN
10.0000 mg | Freq: Once | INTRAMUSCULAR | Status: AC
  Administered 2020-06-20: 10 mg via INTRAMUSCULAR

## 2020-06-20 MED ORDER — ALBUTEROL SULFATE HFA 108 (90 BASE) MCG/ACT IN AERS: 2.0000 | INHALATION_SPRAY | RESPIRATORY_TRACT | 0 refills | Status: DC | PRN

## 2020-06-20 MED ORDER — PROMETHAZINE-DM 6.25-15 MG/5ML PO SYRP: 5.0000 mL | ORAL_SOLUTION | Freq: Four times a day (QID) | ORAL | 0 refills | Status: DC | PRN

## 2020-06-20 NOTE — ED Provider Notes (Signed)
[facility]   [id number] 06/20/20 Arrival Time: 0906   SUBJECTIVE:  Marc Lee is a 35 y.o. male who presents with complaint of nasal congestion, post-nasal drainage, and a persistent cough. Onset abrupt approximately 4 days ago. Overall fatigued. Cough is worse at night and is interfering with sleep. SOB: mild. Wheezing: mild. Has negative history of Covid. Has recently been treated for strep and sinusitis. Reports cough as dry. Has used Hycodan with relief but states that he is out of this medication. Denies fever, nausea, vomiting, diarrhea, rash, other symptoms. Social History   Tobacco Use  Smoking Status Never Smoker  Smokeless Tobacco Current User  . Types: Chew    ROS: As per HPI.   OBJECTIVE:  Vitals:   06/20/20 0915  BP: 127/76  Pulse: 82  Resp: 16  Temp: 98 F (36.7 C)  TempSrc: Oral  SpO2: 99%     General appearance: alert; no distress HEENT: nasal congestion; clear runny nose; throat irritation secondary to post-nasal drainage Neck: supple without LAD Lungs: mild wheezing to bilateral lower lobes Skin: warm and dry Psychological: alert and cooperative; normal mood and affect  Results for orders placed or performed in visit on 01/19/16  Viral culture   Specimen: Back; Body Fluid  Result Value Ref Range   Viral Culture REPORT    Source ?   rflxH. simplex/VZ Virus Cult/Dif  Result Value Ref Range   Herpes simplex/VZV Cult/Dif REPORT (A)    Source Not Provided     Labs Reviewed - No data to display  No results found.  Allergies  Allergen Reactions  . Amoxicillin-Pot Clavulanate Other (See Comments) and Nausea And Vomiting    vomiting    Past Medical History:  Diagnosis Date  . Multiple allergies    Social History   Socioeconomic History  . Marital status: Married    Spouse name: Not on file  . Number of children: Not on file  . Years of education: Not on file  . Highest education level: Not on file  Occupational History   . Not on file  Tobacco Use  . Smoking status: Never Smoker  . Smokeless tobacco: Current User    Types: Chew  Vaping Use  . Vaping Use: Never used  Substance and Sexual Activity  . Alcohol use: Yes  . Drug use: No  . Sexual activity: Not on file  Other Topics Concern  . Not on file  Social History Narrative  . Not on file   Social Determinants of Health   Financial Resource Strain: Not on file  Food Insecurity: Not on file  Transportation Needs: Not on file  Physical Activity: Not on file  Stress: Not on file  Social Connections: Not on file  Intimate Partner Violence: Not on file   Family History  Problem Relation Age of Onset  . Healthy Father      ASSESSMENT & PLAN:  1. Acute bronchitis, unspecified organism     Meds ordered this encounter  Medications  . dexamethasone (DECADRON) injection 10 mg  . predniSONE (STERAPRED UNI-PAK 21 TAB) 10 MG (21) TBPK tablet    Sig: Take by mouth daily for 6 days. Take 6 tablets on day 1, 5 tablets on day 2, 4 tablets on day 3, 3 tablets on day 4, 2 tablets on day 5, 1 tablet on day 6    Dispense:  21 tablet    Refill:  0    Order Specific Question:   Supervising Provider  AnswerMerrilee Jansky [id number]  . albuterol (VENTOLIN HFA) 108 (90 Base) MCG/ACT inhaler    Sig: Inhale 2 puffs into the lungs every 4 (four) hours as needed for wheezing or shortness of breath.    Dispense:  18 g    Refill:  0    Order Specific Question:   Supervising Provider    Answer:   Merrilee Jansky [id number]  . promethazine-dextromethorphan (PROMETHAZINE-DM) 6.25-15 MG/5ML syrup    Sig: Take 5 mLs by mouth 4 (four) times daily as needed for cough.    Dispense:  118 mL    Refill:  0    Order Specific Question:   Supervising Provider    Answer:   Merrilee Jansky [[id number]]    Decadron 10mg  IM in office today Prescribed steroid taper Promethazine cough syrup prescribed Sedation precautions given Prescribed albuterol inhaler as  well  OTC symptom care as needed. Will plan f/u with PCP or here as needed.  Reviewed expectations re: course of current medical issues. Questions answered. Outlined signs and symptoms indicating need for more acute intervention. Patient verbalized understanding. After Visit Summary given.           , NP 06/20/20 [phone]

## 2020-06-20 NOTE — Discharge Instructions (Addendum)
You have received a steroid injection in the office today  I have sent in a prednisone taper for you to take for 6 days. 6 tablets on day one, 5 tablets on day two, 4 tablets on day three, 3 tablets on day four, 2 tablets on day five, and 1 tablet on day six.  I have sent in an albuterol inhaler for you to use 2 puffs every 4-6 hours as needed for cough, shortness of breath, wheezing.  I have sent in cough syrup for you to take. This medication can make you sleepy. Do not drive while taking this medication.  Follow up with this office or with primary care if symptoms are persisting.  Follow up in the ER for high fever, trouble swallowing, trouble breathing, other concerning symptoms.

## 2020-06-20 NOTE — ED Triage Notes (Addendum)
Treated for a sinus infection last week- finished meds Strep 3 weeks Here today for a dry cough  Denies fever or sore throat

## 2020-06-22 ENCOUNTER — Other Ambulatory Visit: Payer: Self-pay | Admitting: Physician Assistant

## 2020-06-22 DIAGNOSIS — B9789 Other viral agents as the cause of diseases classified elsewhere: Secondary | ICD-10-CM

## 2020-06-22 DIAGNOSIS — J019 Acute sinusitis, unspecified: Secondary | ICD-10-CM

## 2020-07-03 ENCOUNTER — Emergency Department (INDEPENDENT_AMBULATORY_CARE_PROVIDER_SITE_OTHER)
Admission: EM | Admit: 2020-07-03 | Discharge: 2020-07-03 | Disposition: A | Discharge status: Home / Self Care | Payer: BC Managed Care – PPO | Source: Home / Self Care

## 2020-07-03 ENCOUNTER — Emergency Department (INDEPENDENT_AMBULATORY_CARE_PROVIDER_SITE_OTHER): Payer: BC Managed Care – PPO

## 2020-07-03 ENCOUNTER — Encounter: Payer: Self-pay | Admitting: Emergency Medicine

## 2020-07-03 ENCOUNTER — Other Ambulatory Visit: Payer: Self-pay

## 2020-07-03 DIAGNOSIS — R053 Chronic cough: Secondary | ICD-10-CM | POA: Diagnosis not present

## 2020-07-03 DIAGNOSIS — J029 Acute pharyngitis, unspecified: Secondary | ICD-10-CM | POA: Diagnosis not present

## 2020-07-03 DIAGNOSIS — J069 Acute upper respiratory infection, unspecified: Secondary | ICD-10-CM

## 2020-07-03 DIAGNOSIS — R059 Cough, unspecified: Secondary | ICD-10-CM | POA: Diagnosis not present

## 2020-07-03 HISTORY — DX: Other allergic rhinitis: J30.89

## 2020-07-03 HISTORY — DX: Unspecified asthma, uncomplicated: J45.909

## 2020-07-03 MED ORDER — DEXAMETHASONE SODIUM PHOSPHATE 10 MG/ML IJ SOLN
10.0000 mg | Freq: Once | INTRAMUSCULAR | Status: AC
  Administered 2020-07-03: 10 mg via INTRAMUSCULAR

## 2020-07-03 MED ORDER — CEFDINIR 300 MG PO CAPS: 300.0000 mg | ORAL_CAPSULE | Freq: Two times a day (BID) | ORAL | 0 refills | Status: AC

## 2020-07-03 NOTE — Discharge Instructions (Addendum)
Your rapid strep test is negative.  A throat culture is pending; we will call you if it is positive requiring treatment.    Chest xray today negative for pneumonia.  You have received a steroid injection in the office today  I have sent in Cefdinir for you to take twice a day for 7 days  If symptoms are not improving you may want to consider seeing an asthma/allergist.

## 2020-07-03 NOTE — ED Provider Notes (Signed)
[facility]   [id number] 07/03/20 Arrival Time: 0855   CC: COVID symptoms  SUBJECTIVE: History from: patient.  Marc Lee is a 35 y.o. male who presents with sore throat, productive cough, laryngitis for the last month. Was seen in this office on 06/20/20 and was treated with steroids, azithromycin, promethazine cough syrup, and albuterol inhaler prn. Reports that he got a little better but still never back to normal. Denies sick exposure to COVID, flu or strep. Denies recent travel. Has negative history of Covid. Has not taken OTC medications for this. Cough is worse with activity. Reports previous symptoms in the past. Denies fever, chills, sinus pain, rhinorrhea, SOB, wheezing, chest pain, nausea, changes in bowel or bladder habits.    ROS: As per HPI.  All other pertinent ROS negative.     Past Medical History:  Diagnosis Date  . Asthma   . Environmental and seasonal allergies   . Multiple allergies    Past Surgical History:  Procedure Laterality Date  . TYMPANOSTOMY     Allergies  Allergen Reactions  . Amoxicillin-Pot Clavulanate Other (See Comments) and Nausea And Vomiting    vomiting   No current facility-administered medications on file prior to encounter.   Current Outpatient Medications on File Prior to Encounter  Medication Sig Dispense Refill  . albuterol (VENTOLIN HFA) 108 (90 Base) MCG/ACT inhaler Inhale 2 puffs into the lungs every 4 (four) hours as needed for wheezing or shortness of breath. 18 g 0  . benzonatate (TESSALON) 100 MG capsule Take 1 capsule (100 mg total) by mouth 3 (three) times daily as needed for cough. (Patient not taking: No sig reported) 30 capsule 0  . cetirizine (ZYRTEC) 10 MG tablet Take 10 mg by mouth daily.    Marland Kitchen ipratropium (ATROVENT) 0.03 % nasal spray PLACE 2 SPRAYS INTO BOTH NOSTRILS EVERY 12 (TWELVE) HOURS. 30 mL 3  . promethazine-dextromethorphan (PROMETHAZINE-DM) 6.25-15 MG/5ML syrup Take 5 mLs by mouth 4 (four) times  daily as needed for cough. 118 mL 0   Social History   Socioeconomic History  . Marital status: Married    Spouse name: Not on file  . Number of children: Not on file  . Years of education: Not on file  . Highest education level: Not on file  Occupational History  . Not on file  Tobacco Use  . Smoking status: Never Smoker  . Smokeless tobacco: Current User    Types: Chew  Vaping Use  . Vaping Use: Never used  Substance and Sexual Activity  . Alcohol use: Yes  . Drug use: No  . Sexual activity: Not on file  Other Topics Concern  . Not on file  Social History Narrative  . Not on file   Social Determinants of Health   Financial Resource Strain: Not on file  Food Insecurity: Not on file  Transportation Needs: Not on file  Physical Activity: Not on file  Stress: Not on file  Social Connections: Not on file  Intimate Partner Violence: Not on file   Family History  Problem Relation Age of Onset  . Healthy Father     OBJECTIVE:  Vitals:   07/03/20 0913 07/03/20 0917  BP: 117/78   Pulse: 63   Resp: 16   Temp: 97.6 F (36.4 C)   TempSrc: Oral   SpO2: 97%   Weight:  246 lb 14.6 oz (112 kg)  Height:  6\' 1"  (1.854 m)     General appearance: alert; appears fatigued, but  nontoxic; speaking in full sentences and tolerating own secretions HEENT: NCAT; Ears: EACs clear, L TM pearly gray, R TM perforated; Eyes: PERRL.  EOM grossly intact. Sinuses: nontender; Nose: nares patent with clear rhinorrhea, Throat: oropharynx erythematous, cobblestoning present, tonsils erythematous and 3+ enlarged, uvula midline  Neck: supple with LAD Lungs: unlabored respirations, symmetrical air entry; cough: moderate; no respiratory distress; mild wheezing to bilateral lower lobes Heart: regular rate and rhythm. Radial pulses 2+ symmetrical bilaterally Skin: warm and dry Psychological: alert and cooperative; normal mood and affect  LABS:  No results found for this or any previous visit  (from the past 24 hour(s)).   ASSESSMENT & PLAN:  1. Upper respiratory tract infection, unspecified type   2. Acute pharyngitis, unspecified etiology     Meds ordered this encounter  Medications  . cefdinir (OMNICEF) 300 MG capsule    Sig: Take 1 capsule (300 mg total) by mouth 2 (two) times daily for 7 days.    Dispense:  14 capsule    Refill:  0    Order Specific Question:   Supervising Provider    Answer:   Merrilee Jansky [id number]  . dexamethasone (DECADRON) injection 10 mg    Chest xray for productive cough and length of symptoms, negative for pneumonia in office today Decadron 10mg  IM in office today Prescribed Cefdinir 300mg  BID x 7 days for URI Continue to use inhaler prn Continue supportive care at home Get plenty of rest and push fluids Use OTC zyrtec for nasal congestion, runny nose, and/or sore throat Use OTC flonase for nasal congestion and runny nose Use medications daily for symptom relief Use OTC medications like ibuprofen or tylenol as needed fever or pain Consider asthma/allergist if symptoms are persisting Call or go to the ED if you have any new or worsening symptoms such as fever, worsening cough, shortness of breath, chest tightness, chest pain, turning blue, changes in mental status.  Reviewed expectations re: course of current medical issues. Questions answered. Outlined signs and symptoms indicating need for more acute intervention. Patient verbalized understanding. After Visit Summary given.        , NP 07/03/20 1221

## 2020-07-03 NOTE — ED Triage Notes (Signed)
Patient states problems started with strep throat one month ago. Also reports current hole in right "eardrum" that he is supposed to have surgery on.

## 2020-07-03 NOTE — ED Triage Notes (Signed)
Patient here for ongoing cough and sore throat with occasional shortness of breath; has been ill about a month; here for evaluation one week ago and has completed z-pack. No covid vaccinations.

## 2020-07-06 LAB — CULTURE, GROUP A STREP
MICRO NUMBER:: 11669402
SPECIMEN QUALITY:: ADEQUATE

## 2020-09-07 ENCOUNTER — Encounter (HOSPITAL_BASED_OUTPATIENT_CLINIC_OR_DEPARTMENT_OTHER): Payer: Self-pay | Admitting: Emergency Medicine

## 2020-09-07 ENCOUNTER — Emergency Department (HOSPITAL_BASED_OUTPATIENT_CLINIC_OR_DEPARTMENT_OTHER)
Admission: EM | Admit: 2020-09-07 | Discharge: 2020-09-08 | Disposition: A | Discharge status: Home / Self Care | Payer: BC Managed Care – PPO | Attending: Emergency Medicine | Admitting: Emergency Medicine

## 2020-09-07 ENCOUNTER — Emergency Department (HOSPITAL_BASED_OUTPATIENT_CLINIC_OR_DEPARTMENT_OTHER): Payer: BC Managed Care – PPO

## 2020-09-07 ENCOUNTER — Other Ambulatory Visit: Payer: Self-pay

## 2020-09-07 DIAGNOSIS — Z23 Encounter for immunization: Secondary | ICD-10-CM | POA: Diagnosis not present

## 2020-09-07 DIAGNOSIS — R5381 Other malaise: Secondary | ICD-10-CM | POA: Diagnosis not present

## 2020-09-07 DIAGNOSIS — F17220 Nicotine dependence, chewing tobacco, uncomplicated: Secondary | ICD-10-CM | POA: Diagnosis not present

## 2020-09-07 DIAGNOSIS — Y92410 Unspecified street and highway as the place of occurrence of the external cause: Secondary | ICD-10-CM | POA: Insufficient documentation

## 2020-09-07 DIAGNOSIS — S80812A Abrasion, left lower leg, initial encounter: Secondary | ICD-10-CM | POA: Diagnosis not present

## 2020-09-07 DIAGNOSIS — M545 Low back pain, unspecified: Secondary | ICD-10-CM | POA: Insufficient documentation

## 2020-09-07 DIAGNOSIS — S161XXA Strain of muscle, fascia and tendon at neck level, initial encounter: Secondary | ICD-10-CM | POA: Insufficient documentation

## 2020-09-07 DIAGNOSIS — R531 Weakness: Secondary | ICD-10-CM | POA: Diagnosis not present

## 2020-09-07 DIAGNOSIS — M25561 Pain in right knee: Secondary | ICD-10-CM | POA: Diagnosis not present

## 2020-09-07 DIAGNOSIS — S199XXA Unspecified injury of neck, initial encounter: Secondary | ICD-10-CM | POA: Diagnosis not present

## 2020-09-07 DIAGNOSIS — S86911A Strain of unspecified muscle(s) and tendon(s) at lower leg level, right leg, initial encounter: Secondary | ICD-10-CM

## 2020-09-07 DIAGNOSIS — R519 Headache, unspecified: Secondary | ICD-10-CM | POA: Insufficient documentation

## 2020-09-07 MED ORDER — TETANUS-DIPHTH-ACELL PERTUSSIS 5-2.5-18.5 LF-MCG/0.5 IM SUSY
0.5000 mL | PREFILLED_SYRINGE | Freq: Once | INTRAMUSCULAR | Status: AC
  Administered 2020-09-08: 0.5 mL via INTRAMUSCULAR
  Filled 2020-09-07: qty 0.5
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
## 2020-09-08 DIAGNOSIS — M25561 Pain in right knee: Secondary | ICD-10-CM | POA: Diagnosis not present

## 2020-09-08 MED ORDER — NAPROXEN 250 MG PO TABS
500.0000 mg | ORAL_TABLET | Freq: Once | ORAL | Status: AC
  Administered 2020-09-08: 500 mg via ORAL
  Filled 2020-09-08: qty 2

## 2020-10-29 ENCOUNTER — Telehealth (INDEPENDENT_AMBULATORY_CARE_PROVIDER_SITE_OTHER): Payer: BC Managed Care – PPO | Admitting: Family Medicine

## 2020-10-29 ENCOUNTER — Encounter: Payer: Self-pay | Admitting: Family Medicine

## 2020-10-29 DIAGNOSIS — R059 Cough, unspecified: Secondary | ICD-10-CM | POA: Diagnosis not present

## 2020-10-29 DIAGNOSIS — R0981 Nasal congestion: Secondary | ICD-10-CM | POA: Diagnosis not present

## 2020-10-29 DIAGNOSIS — J3089 Other allergic rhinitis: Secondary | ICD-10-CM

## 2020-10-29 MED ORDER — HYDROCOD POLST-CPM POLST ER 10-8 MG/5ML PO SUER: 5.0000 mL | Freq: Two times a day (BID) | ORAL | 0 refills | Status: AC | PRN

## 2020-10-29 MED ORDER — PREDNISONE 20 MG PO TABS: 40.0000 mg | ORAL_TABLET | Freq: Every day | ORAL | 0 refills | Status: AC

## 2020-10-29 NOTE — Progress Notes (Signed)
Virtual Video Visit via MyChart Note  I connected with  Marc Lee on 10/29/20 at  2:20 PM EDT by the video enabled telemedicine application for MyChart, and verified that I am speaking with the correct person using two identifiers.   I introduced myself as a Publishing rights manager with the practice. We discussed the limitations of evaluation and management by telemedicine and the availability of in person appointments. The patient expressed understanding and agreed to proceed.  Participating parties in this visit include: The patient and the nurse practitioner listed.  The patient is: At home I am: In the office - Primary Care [name]  Subjective:    CC:  Chief Complaint  Patient presents with   Sinusitis     HPI: Marc Lee is a 35 y.o. year old male presenting today via MyChart today for sinus pressure.  On Tuesday, patient was working outside in the weeds and reports he started sneezing, post nasal drainage, sinus pressure, headache, cough. Reports headaches come and go and can be up to 7/10, frontal/ethmoid sinus pressure 8/10, cough that keeps him up at night. Feels slightly better today, but not much. He is currently finishing a course of Ceftin from a [facility] for ear infection. No fevers, loss of taste/smell, dyspnea, chest pain, GI/GU changes. Takes zyrtec daily.     Past medical history, Surgical history, Family history not pertinant except as noted below, Social history, Allergies, and medications have been entered into the medical record, reviewed, and corrections made.   Review of Systems:  All review of systems negative except what is listed in the HPI   Objective:    General:  Speaking clearly in complete sentences. Absent shortness of breath noted.   Alert and oriented x3.   Normal judgment.  Absent acute distress.   Impression and Recommendations:    1. Cough - chlorpheniramine-HYDROcodone (TUSSIONEX) 10-8 MG/5ML SUER; Take 5 mLs by mouth  every 12 (twelve) hours as needed for up to 4 days for cough (cough, will cause drowsiness.).  Dispense: 40 mL; Refill: 0  2. Sinus congestion - predniSONE (DELTASONE) 20 MG tablet; Take 2 tablets (40 mg total) by mouth daily with breakfast for 5 days.  Dispense: 10 tablet; Refill: 0  3. Allergic rhinitis due to other allergic trigger, unspecified seasonality   Too early to start antibiotics, but will go ahead and give prednisone burst to help with the sinus pressure he is experiencing. Patient also requesting something stronger for night time cough that will help him sleep - sending in Tussionex. PDMP reviewed. Continue nasal spray, allergy medication, rest, hydration, humidifier use, warm compresses, OTC analgesics. Complete course of ABX for previously diagnosed ear infection. Patient aware of signs/symptoms requiring further/urgent evaluation.   Follow-up if symptoms worsen or fail to improve.    I discussed the assessment and treatment plan with the patient. The patient was provided an opportunity to ask questions and all were answered. The patient agreed with the plan and demonstrated an understanding of the instructions.   The patient was advised to call back or seek an in-person evaluation if the symptoms worsen or if the condition fails to improve as anticipated.  I provided 20 minutes of non-face-to-face interaction with this MYCHART visit including intake, same-day documentation, and chart review.   Clayborne Dana, NP

## 2021-01-09 ENCOUNTER — Other Ambulatory Visit: Payer: Self-pay

## 2021-01-09 ENCOUNTER — Emergency Department: Admit: 2021-01-09 | Payer: Self-pay

## 2021-01-09 ENCOUNTER — Encounter: Payer: Self-pay | Admitting: Emergency Medicine

## 2021-01-09 ENCOUNTER — Emergency Department (INDEPENDENT_AMBULATORY_CARE_PROVIDER_SITE_OTHER)
Admission: EM | Admit: 2021-01-09 | Discharge: 2021-01-09 | Disposition: A | Discharge status: Home / Self Care | Payer: BC Managed Care – PPO | Source: Home / Self Care

## 2021-01-09 DIAGNOSIS — H9201 Otalgia, right ear: Secondary | ICD-10-CM

## 2021-01-09 DIAGNOSIS — H6091 Unspecified otitis externa, right ear: Secondary | ICD-10-CM

## 2021-01-09 MED ORDER — CIPROFLOXACIN-DEXAMETHASONE 0.3-0.1 % OT SUSP: 4.0000 [drp] | Freq: Two times a day (BID) | OTIC | 0 refills | Status: AC

## 2021-01-09 NOTE — ED Provider Notes (Signed)
[facility]    CSN: [id number] Arrival date & time: 01/09/21  6659      History   Chief Complaint Chief Complaint  Patient presents with   Otalgia    HPI Marc Lee is a 35 y.o. male.   HPI 35 year old male presents with right ear pain for 1 month.  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  PMH significant for sinobronchitis, allergic rhinitis, and bilateral eustachian tube dysfunction.  Past Medical History:  Diagnosis Date   Environmental and seasonal allergies    Multiple allergies     Patient Active Problem List   Diagnosis Date Noted   Sinobronchitis 09/04/2019   Left wrist injury 11/21/2017   Dysfunction of both eustachian tubes 06/15/2016   Low back pain 12/24/2015   Hyperlipidemia 09/27/2015   Well adult 09/24/2015   Dermatitis 09/24/2015   Tobacco dipper 09/24/2015   Allergic rhinitis 09/19/2012    Past Surgical History:  Procedure Laterality Date   TYMPANOSTOMY         Home Medications    Prior to Admission medications   Medication Sig Start Date End Date Taking? Authorizing Provider  ciprofloxacin-dexamethasone (CIPRODEX) OTIC suspension Place 4 drops into the right ear 2 (two) times daily for 7 days. 01/09/21 01/16/21 Yes Trevor Iha, FNP  fluticasone (FLONASE) 50 MCG/ACT nasal spray Place 2 sprays into both nostrils daily.   Yes [provider]  albuterol (VENTOLIN HFA) 108 (90 Base) MCG/ACT inhaler Inhale 2 puffs into the lungs every 4 (four) hours as needed for wheezing or shortness of breath. 06/20/20   Moshe Cipro, NP  cetirizine (ZYRTEC) 10 MG tablet Take 10 mg by mouth daily.    [provider]  ipratropium (ATROVENT) 0.03 % nasal spray PLACE 2 SPRAYS INTO BOTH NOSTRILS EVERY 12 (TWELVE) HOURS. 06/22/20   Jomarie Longs, PA-C    Family History Family History  Problem Relation Age of Onset   Healthy Father     Social History Social History   Tobacco Use   Smoking status: Never    Smokeless tobacco: Current    Types: Chew  Vaping Use   Vaping Use: Never used  Substance Use Topics   Alcohol use: Yes    Comment: social   Drug use: No     Allergies   Amoxicillin-pot clavulanate   Review of Systems Review of Systems  HENT:  Positive for ear pain.   All other systems reviewed and are negative.   Physical Exam Triage Vital Signs ED Triage Vitals  Enc Vitals Group     BP 01/09/21 1010 118/80     Pulse Rate 01/09/21 1010 61     Resp 01/09/21 1010 16     Temp 01/09/21 1010 98.1 F (36.7 C)     Temp Source 01/09/21 1010 Oral     SpO2 01/09/21 1010 98 %     Weight --      Height --      Head Circumference --      Peak Flow --      Pain Score 01/09/21 1007 5     Pain Loc --      Pain Edu? --      Excl. in GC? --    No data found.  Updated Vital Signs BP 118/80 (BP Location: Right Arm)   Pulse 61   Temp 98.1 F (36.7 C) (Oral)   Resp 16   SpO2 98%      Physical Exam Vitals and nursing  note reviewed.  Constitutional:      General: He is not in acute distress.    Appearance: Normal appearance. He is normal weight. He is not ill-appearing.  HENT:     Head: Normocephalic and atraumatic.     Right Ear: Tympanic membrane and external ear normal.     Left Ear: Tympanic membrane, ear canal and external ear normal.     Ears:     Comments: Right EAC: Erythematous, inflamed, macerated    Mouth/Throat:     Mouth: Mucous membranes are moist.     Pharynx: Oropharynx is clear.  Eyes:     Extraocular Movements: Extraocular movements intact.     Conjunctiva/sclera: Conjunctivae normal.     Pupils: Pupils are equal, round, and reactive to light.  Cardiovascular:     Rate and Rhythm: Normal rate and regular rhythm.     Pulses: Normal pulses.     Heart sounds: Normal heart sounds.  Pulmonary:     Effort: Pulmonary effort is normal.     Breath sounds: Normal breath sounds.     Comments: No adventitious breath sounds noted Musculoskeletal:         General: Normal range of motion.     Cervical back: Normal range of motion and neck supple.  Skin:    General: Skin is warm and dry.  Neurological:     General: No focal deficit present.     Mental Status: He is alert and oriented to person, place, and time. Mental status is at baseline.  Psychiatric:        Mood and Affect: Mood normal.        Behavior: Behavior normal.        Thought Content: Thought content normal.     UC Treatments / Results  Labs (all labs ordered are listed, but only abnormal results are displayed) Labs Reviewed - No data to display  EKG   Radiology No results found.  Procedures Procedures (including critical care time)  Medications Ordered in UC Medications - No data to display  Initial Impression / Assessment and Plan / UC Course  I have reviewed the triage vital signs and the nursing notes.  Pertinent labs & imaging results that were available during my care of the patient were reviewed by me and considered in my medical decision making (see chart for details).     MDM: 1.  Otitis externa of right ear, unspecified chronicity, unspecified type-Rx'd Ciprodex.Advised patient to take medication as directed.  Encourage patient not to submerge head underwater for the next 10 days.  Patient discharged home, hemodynamically stable. Final Clinical Impressions(s) / UC Diagnoses   Final diagnoses:  Right ear pain  Otitis externa of right ear, unspecified chronicity, unspecified type     Discharge Instructions      Advised patient to take medication as directed.  Encourage patient not to submerge head underwater for the next 10 days.     ED Prescriptions     Medication Sig Dispense Auth. Provider   ciprofloxacin-dexamethasone (CIPRODEX) OTIC suspension Place 4 drops into the right ear 2 (two) times daily for 7 days. 7.5 mL Trevor Iha, FNP      PDMP not reviewed this encounter.   Trevor Iha, FNP 01/09/21 1052

## 2021-01-09 NOTE — ED Triage Notes (Signed)
Patient presents to Urgent Care with complaints of right ear pain since 1 month ago. Patient reports ear pain of the right ear. Believe he has gotten water in the ear. He does have a whole in the right ear. Does has soreness of the ear

## 2021-01-09 NOTE — Discharge Instructions (Addendum)
Advised patient to take medication as directed.  Encourage patient not to submerge head underwater for the next 10 days.

## 2021-04-14 ENCOUNTER — Other Ambulatory Visit: Payer: Self-pay

## 2021-04-14 ENCOUNTER — Encounter: Payer: Self-pay | Admitting: Family Medicine

## 2021-04-14 ENCOUNTER — Ambulatory Visit (INDEPENDENT_AMBULATORY_CARE_PROVIDER_SITE_OTHER): Payer: BC Managed Care – PPO | Admitting: Family Medicine

## 2021-04-14 VITALS — BP 135/74 | HR 71 | Resp 18 | Ht 73.0 in | Wt 254.0 lb

## 2021-04-14 DIAGNOSIS — H60331 Swimmer's ear, right ear: Secondary | ICD-10-CM

## 2021-04-14 DIAGNOSIS — B079 Viral wart, unspecified: Secondary | ICD-10-CM | POA: Diagnosis not present

## 2021-04-14 DIAGNOSIS — H1032 Unspecified acute conjunctivitis, left eye: Secondary | ICD-10-CM | POA: Diagnosis not present

## 2021-04-14 MED ORDER — POLYMYXIN B-TRIMETHOPRIM 10000-0.1 UNIT/ML-% OP SOLN: 2.0000 [drp] | Freq: Four times a day (QID) | OPHTHALMIC | 0 refills | Status: DC

## 2021-04-14 MED ORDER — CIPROFLOXACIN-DEXAMETHASONE 0.3-0.1 % OT SUSP: 4.0000 [drp] | Freq: Two times a day (BID) | OTIC | 0 refills | Status: DC

## 2021-04-14 NOTE — Progress Notes (Signed)
Acute Office Visit  Subjective:    Patient ID: Marc Lee, male    DOB: 24-Mar-1986, 35 y.o.   MRN: [id number]  Chief Complaint  Patient presents with   Conjunctivitis    Left eye   Ear Pain    Right ear. Patient states he has a hole in his ear and his ear has been painful for 2-3 months    warts    Patient states he has 4 warts on his hands, 6 months     HPI Patient is in today for   Right ear. Patient states he has a hole in his ear and his ear was really painful and bothering him about 2 to 3 weeks ago.  Now its not as painful but it just really itches.  He has had multiple sets of ear tubes as a child and says he is planning on following back up with ENT soon to discuss a patch on the tympanic membrane.   Warts on hands x 6 months.  He wears heavy-duty gloves at work and tries to keep them as clean as he can but feels like it spreading the warts.  He also noticed that his left eye started feeling irritated and red yesterday so for about the last 2 days it is mostly just been draining but no significant pain or discomfort and no vision change or loss.  He said his wife had symptoms first.  Past Medical History:  Diagnosis Date   Environmental and seasonal allergies    Multiple allergies     Past Surgical History:  Procedure Laterality Date   TYMPANOSTOMY      Family History  Problem Relation Age of Onset   Healthy Father     Social History   Socioeconomic History   Marital status: Married    Spouse name: Not on file   Number of children: Not on file   Years of education: Not on file   Highest education level: Not on file  Occupational History   Not on file  Tobacco Use   Smoking status: Never   Smokeless tobacco: Current    Types: Chew  Vaping Use   Vaping Use: Never used  Substance and Sexual Activity   Alcohol use: Yes    Comment: social   Drug use: No   Sexual activity: Yes  Other Topics Concern   Not on file  Social History Narrative   Not  on file   Social Determinants of Health   Financial Resource Strain: Not on file  Food Insecurity: Not on file  Transportation Needs: Not on file  Physical Activity: Not on file  Stress: Not on file  Social Connections: Not on file  Intimate Partner Violence: Not on file    Outpatient Medications Prior to Visit  Medication Sig Dispense Refill   albuterol (VENTOLIN HFA) 108 (90 Base) MCG/ACT inhaler Inhale 2 puffs into the lungs every 4 (four) hours as needed for wheezing or shortness of breath. 18 g 0   cetirizine (ZYRTEC) 10 MG tablet Take 10 mg by mouth daily.     fluticasone (FLONASE) 50 MCG/ACT nasal spray Place 2 sprays into both nostrils daily.     ipratropium (ATROVENT) 0.03 % nasal spray PLACE 2 SPRAYS INTO BOTH NOSTRILS EVERY 12 (TWELVE) HOURS. (Patient not taking: Reported on 04/14/2021) 30 mL 3   No facility-administered medications prior to visit.    Allergies  Allergen Reactions   Clavulanic Acid Nausea And Vomiting    Review of  Systems     Objective:    Physical Exam Constitutional:      Appearance: He is well-developed.  HENT:     Head: Normocephalic and atraumatic.     Comments: Right ear canal with moist white debris.  TM with chronic perforation.    Right Ear: External ear normal.     Left Ear: Tympanic membrane, ear canal and external ear normal.     Nose: Nose normal.  Eyes:     Pupils: Pupils are equal, round, and reactive to light.     Comments: Left sclera is injected.  Extraocular movements intact.  No significant lid swelling.  Neck:     Thyroid: No thyromegaly.  Cardiovascular:     Rate and Rhythm: Normal rate.     Heart sounds: Normal heart sounds.  Pulmonary:     Effort: Pulmonary effort is normal.     Breath sounds: Normal breath sounds.  Musculoskeletal:     Cervical back: Neck supple.  Lymphadenopathy:     Cervical: No cervical adenopathy.  Skin:    General: Skin is warm and dry.     Comments: 2 warts on each hand for total of  4.  Neurological:     Mental Status: He is alert and oriented to person, place, and time.    BP 135/74    Pulse 71    Resp 18    Ht 6\' 1"  (1.854 m)    Wt 254 lb (115.2 kg)    SpO2 97%    BMI 33.51 kg/m  Wt Readings from Last 3 Encounters:  04/14/21 254 lb (115.2 kg)  09/07/20 238 lb (108 kg)  07/03/20 246 lb 14.6 oz (112 kg)    Health Maintenance Due  Topic Date Due   HIV Screening  Never done   Hepatitis C Screening  Never done    There are no preventive care reminders to display for this patient.   Lab Results  Component Value Date   TSH 0.81 09/24/2015   Lab Results  Component Value Date   WBC 8.1 09/24/2015   HGB 15.9 09/24/2015   HCT 46.3 09/24/2015   MCV 81.9 09/24/2015   PLT 202 09/24/2015   Lab Results  Component Value Date   NA 138 09/24/2015   K 4.7 09/24/2015   CO2 25 09/24/2015   GLUCOSE 92 09/24/2015   BUN 10 09/24/2015   CREATININE 0.93 09/24/2015   BILITOT 0.6 09/24/2015   ALKPHOS 70 09/24/2015   AST 22 09/24/2015   ALT 32 09/24/2015   PROT 6.8 09/24/2015   ALBUMIN 4.3 09/24/2015   CALCIUM 9.4 09/24/2015   Lab Results  Component Value Date   CHOL 220 (H) 09/24/2015   Lab Results  Component Value Date   HDL 31 (L) 09/24/2015   Lab Results  Component Value Date   LDLCALC 126 09/24/2015   Lab Results  Component Value Date   TRIG 315 (H) 09/24/2015   Lab Results  Component Value Date   CHOLHDL 7.1 (H) 09/24/2015   Lab Results  Component Value Date   HGBA1C 5.5 09/24/2015       Assessment & Plan:   Problem List Items Addressed This Visit   None Visit Diagnoses     Acute conjunctivitis of left eye, unspecified acute conjunctivitis type    -  Primary   Relevant Medications   trimethoprim-polymyxin b (POLYTRIM) ophthalmic solution   Acute swimmer's ear of right side       Relevant Medications  ciprofloxacin-dexamethasone (CIPRODEX) OTIC suspension   Wart of hand          Acute conjunctivitis-recommend Polytrim drops.   They did discuss that sometimes it can be viral.  Swimmer's ear based on exam today we will treat with Cipro Dex otic.  Recommend follow-up in 2 weeks to recheck his ear.  But he travels a lot and works out of town so he may or may not be able to come back in but were happy to look at it.  Warts-patient desires cryotherapy treatment today.  Patient tolerated procedure well.  Recommend follow-up in 3 to 4 weeks for repeat cryo if not resolved  Cryotherapy Procedure Note  Pre-operative Diagnosis: warts   Post-operative Diagnosis: same  Locations: 2 on each hand. Left palm and left wrist, right finger  Indications: irritation, cracking  Anesthesia: not required     Patient informed of risks (permanent scarring, infection, light or dark discoloration, bleeding, infection, weakness, numbness and recurrence of the lesion) and benefits of the procedure and verbal informed consent obtained.  The areas are treated with liquid nitrogen therapy, frozen until ice ball extended 1-2 mm beyond lesion, allowed to thaw, and treated again. The patient tolerated procedure well.  The patient was instructed on post-op care, warned that there may be blister formation, redness and pain. Recommend OTC analgesia as needed for pain.  Condition: Stable  Complications: none.  Plan: 1. Instructed to keep the area dry and covered for 24-48h and clean thereafter. 2. Warning signs of infection were reviewed.     Meds ordered this encounter  Medications   ciprofloxacin-dexamethasone (CIPRODEX) OTIC suspension    Sig: Place 4 drops into the right ear 2 (two) times daily. X 7 days    Dispense:  7.5 mL    Refill:  0   trimethoprim-polymyxin b (POLYTRIM) ophthalmic solution    Sig: Place 2 drops into the left eye every 6 (six) hours. X 5 days    Dispense:  10 mL    Refill:  0     Nani Gasser, MD

## 2021-04-15 ENCOUNTER — Encounter: Payer: Self-pay | Admitting: Family Medicine

## 2021-04-15 MED ORDER — OFLOXACIN 0.3 % OP SOLN: 2.0000 [drp] | OPHTHALMIC | 0 refills | Status: DC

## 2021-04-19 ENCOUNTER — Other Ambulatory Visit: Payer: Self-pay

## 2021-04-19 ENCOUNTER — Telehealth (INDEPENDENT_AMBULATORY_CARE_PROVIDER_SITE_OTHER): Payer: BC Managed Care – PPO | Admitting: Family Medicine

## 2021-04-19 ENCOUNTER — Encounter: Payer: Self-pay | Admitting: Family Medicine

## 2021-04-19 DIAGNOSIS — J039 Acute tonsillitis, unspecified: Secondary | ICD-10-CM | POA: Diagnosis not present

## 2021-04-19 DIAGNOSIS — J0140 Acute pansinusitis, unspecified: Secondary | ICD-10-CM | POA: Diagnosis not present

## 2021-04-19 MED ORDER — OFLOXACIN 0.3 % OP SOLN: 2.0000 [drp] | OPHTHALMIC | 0 refills | Status: DC

## 2021-04-19 MED ORDER — AMOXICILLIN 500 MG PO CAPS: 500.0000 mg | ORAL_CAPSULE | Freq: Two times a day (BID) | ORAL | 0 refills | Status: AC

## 2021-04-19 NOTE — Progress Notes (Signed)
Virtual Video Visit via MyChart Note  I connected with  [name] on 04/19/21 at  2:20 PM EST by the video enabled telemedicine application for MyChart, and verified that I am speaking with the correct person using two identifiers.   I introduced myself as a Designer, jewellery with the practice. We discussed the limitations of evaluation and management by telemedicine and the availability of in person appointments. The patient expressed understanding and agreed to proceed.  Participating parties in this visit include: The patient and the nurse practitioner listed.  The patient is: At home I am: In the office - [facility]  Subjective:    CC:  Chief Complaint  Patient presents with   Sore Throat    HPI: Marc Lee is a 36 y.o. year old male presenting today via [place] today for sore throat.  Patient states that yesterday morning he woke up with a significant sore throat and noticed some white dots on the back of his throat. States it is hard to swallow due to the swelling and pain. Reports his left tonsil looks very swollen and he can feel bilateral cervical lymphadenopathy. Sore throat is typically 9/10 first thing in the morning and then improves to about 5/10 with liquids and ibuprofen. No known flu or COVID exposures. He denies fevers, cough, nausea, vomiting, diarrhea. Reports that he has > 1 week of sinus drainage/pressure with yellow mucus.   He is currently working in [place] and unable to come by for testing.      Past medical history, Surgical history, Family history not pertinant except as noted below, Social history, Allergies, and medications have been entered into the medical record, reviewed, and corrections made.   Review of Systems:  All review of systems negative except what is listed in the HPI   Objective:    General:  Speaking clearly in complete sentences. Absent shortness of breath noted.   Alert and oriented x3.   Normal judgment.   Absent acute distress.   Impression and Recommendations:    1. Acute non-recurrent pansinusitis - amoxicillin (AMOXIL) 500 MG capsule; Take 1 capsule (500 mg total) by mouth 2 (two) times daily for 10 days.  Dispense: 20 capsule; Refill: 0  2. Tonsillitis - amoxicillin (AMOXIL) 500 MG capsule; Take 1 capsule (500 mg total) by mouth 2 (two) times daily for 10 days.  Dispense: 20 capsule; Refill: 0   Unable to visualize tonsils due to video quality, but presentation concerning for strep. Given that he has also had sinus symptoms for >1 week and significant pharyngitis, will go ahead and use amoxicillin to cover both. He cannot come in for testing because he is out of state. Continue supportive measures including rest, hydration, humidifier use, steam showers, warm compresses to sinuses, warm liquids with lemon and honey, and over-the-counter cough, cold, and analgesics as needed.  Patient aware of signs/symptoms requiring further/urgent evaluation.     Follow-up if symptoms worsen or fail to improve.    I discussed the assessment and treatment plan with the patient. The patient was provided an opportunity to ask questions and all were answered. The patient agreed with the plan and demonstrated an understanding of the instructions.   The patient was advised to call back or seek an in-person evaluation if the symptoms worsen or if the condition fails to improve as anticipated.  I spent 20 minutes dedicated to the care of this patient on the date of this encounter to include pre-visit chart review of prior  notes and results, face-to-face time with the patient, and post-visit ordering of testing as indicated.   Terrilyn Saver, NP

## 2021-04-19 NOTE — Addendum Note (Signed)
Addended by: Stan Head on: 04/19/2021 11:47 AM   Modules accepted: Orders

## 2021-08-05 DIAGNOSIS — H7291 Unspecified perforation of tympanic membrane, right ear: Secondary | ICD-10-CM | POA: Diagnosis not present

## 2021-08-05 DIAGNOSIS — H7412 Adhesive left middle ear disease: Secondary | ICD-10-CM | POA: Diagnosis not present

## 2021-08-05 DIAGNOSIS — H9211 Otorrhea, right ear: Secondary | ICD-10-CM | POA: Diagnosis not present

## 2021-08-18 ENCOUNTER — Encounter: Payer: Self-pay | Admitting: Family Medicine

## 2021-08-19 MED ORDER — MELOXICAM 15 MG PO TABS: 15.0000 mg | ORAL_TABLET | Freq: Every day | ORAL | 1 refills | Status: DC

## 2021-08-19 NOTE — Addendum Note (Signed)
Addended by: Donella Stade on: 08/19/2021 10:50 AM ? ? Modules accepted: Orders ? ?

## 2021-10-15 ENCOUNTER — Other Ambulatory Visit: Payer: Self-pay | Admitting: Physician Assistant

## 2021-11-07 DIAGNOSIS — H9211 Otorrhea, right ear: Secondary | ICD-10-CM | POA: Diagnosis not present

## 2021-11-07 DIAGNOSIS — H7291 Unspecified perforation of tympanic membrane, right ear: Secondary | ICD-10-CM | POA: Diagnosis not present

## 2021-11-07 DIAGNOSIS — H6121 Impacted cerumen, right ear: Secondary | ICD-10-CM | POA: Diagnosis not present

## 2021-11-07 DIAGNOSIS — H7412 Adhesive left middle ear disease: Secondary | ICD-10-CM | POA: Diagnosis not present

## 2021-11-15 DIAGNOSIS — H7412 Adhesive left middle ear disease: Secondary | ICD-10-CM | POA: Diagnosis not present

## 2021-11-15 DIAGNOSIS — H7413 Adhesive middle ear disease, bilateral: Secondary | ICD-10-CM | POA: Diagnosis not present

## 2021-11-15 DIAGNOSIS — H6983 Other specified disorders of Eustachian tube, bilateral: Secondary | ICD-10-CM | POA: Diagnosis not present

## 2021-11-15 DIAGNOSIS — H7411 Adhesive right middle ear disease: Secondary | ICD-10-CM | POA: Diagnosis not present

## 2021-11-15 DIAGNOSIS — H7291 Unspecified perforation of tympanic membrane, right ear: Secondary | ICD-10-CM | POA: Diagnosis not present

## 2021-11-15 DIAGNOSIS — H9211 Otorrhea, right ear: Secondary | ICD-10-CM | POA: Diagnosis not present

## 2022-03-16 ENCOUNTER — Encounter: Payer: Self-pay | Admitting: Physician Assistant

## 2022-03-17 MED ORDER — TRIAMCINOLONE ACETONIDE 0.1 % EX CREA: 1.0000 | TOPICAL_CREAM | Freq: Two times a day (BID) | CUTANEOUS | 5 refills | Status: AC

## 2022-03-17 MED ORDER — TRIAMCINOLONE ACETONIDE 0.1 % EX CREA: 1.0000 | TOPICAL_CREAM | Freq: Two times a day (BID) | CUTANEOUS | 5 refills | Status: DC

## 2022-03-17 NOTE — Addendum Note (Signed)
Addended bySilvio Pate on: 03/17/2022 08:57 AM   Modules accepted: Orders

## 2022-03-17 NOTE — Telephone Encounter (Signed)
Pended Triamcinolone cream, this was the only RX he has been on in the past. Please advise.

## 2022-05-26 ENCOUNTER — Encounter: Payer: Self-pay | Admitting: Emergency Medicine

## 2022-05-26 ENCOUNTER — Ambulatory Visit
Admission: EM | Admit: 2022-05-26 | Discharge: 2022-05-26 | Disposition: A | Discharge status: Home / Self Care | Payer: BC Managed Care – PPO

## 2022-05-26 DIAGNOSIS — J329 Chronic sinusitis, unspecified: Secondary | ICD-10-CM

## 2022-05-26 DIAGNOSIS — H65193 Other acute nonsuppurative otitis media, bilateral: Secondary | ICD-10-CM

## 2022-05-26 DIAGNOSIS — J309 Allergic rhinitis, unspecified: Secondary | ICD-10-CM

## 2022-05-26 MED ORDER — PREDNISONE 20 MG PO TABS: 40.0000 mg | ORAL_TABLET | Freq: Every day | ORAL | 0 refills | Status: DC

## 2022-05-26 MED ORDER — PSEUDOEPHEDRINE HCL 60 MG PO TABS: 60.0000 mg | ORAL_TABLET | Freq: Three times a day (TID) | ORAL | 0 refills | Status: DC | PRN

## 2022-05-26 MED ORDER — PROMETHAZINE-DM 6.25-15 MG/5ML PO SYRP: 5.0000 mL | ORAL_SOLUTION | Freq: Three times a day (TID) | ORAL | 0 refills | Status: DC | PRN

## 2022-05-26 MED ORDER — AMOXICILLIN 875 MG PO TABS: 875.0000 mg | ORAL_TABLET | Freq: Two times a day (BID) | ORAL | 0 refills | Status: DC

## 2022-05-26 NOTE — ED Provider Notes (Signed)
[facility]  Note:  This document was prepared using Dragon voice recognition software and may include unintentional dictation errors.  MRN: [id number] DOB: 08-Feb-1986  Subjective:   Marc Lee is a 37 y.o. male presenting for 3 day history of recurrent right ear pain, pulsations.  Has had persistent sinus congestion, coughing for the past month.  Has significant sinus pressure and sinus congestion.  He did have a video visit 04/19/2021 and states that got a course of amoxicillin then and did well.  But when he finished it the symptoms came right back.  Cannot tolerate Augmentin take Xyzal daily for his allergies.  No current facility-administered medications for this encounter.  Current Outpatient Medications:    levocetirizine (XYZAL) 5 MG tablet, Take 5 mg by mouth every evening., Disp: , Rfl:    cetirizine (ZYRTEC) 10 MG tablet, Take 10 mg by mouth daily., Disp: , Rfl:    ciprofloxacin-dexamethasone (CIPRODEX) OTIC suspension, Place 4 drops into the right ear 2 (two) times daily. X 7 days, Disp: 7.5 mL, Rfl: 0   fluticasone (FLONASE) 50 MCG/ACT nasal spray, Place 2 sprays into both nostrils daily., Disp: , Rfl:    ipratropium (ATROVENT) 0.03 % nasal spray, PLACE 2 SPRAYS INTO BOTH NOSTRILS EVERY 12 (TWELVE) HOURS. (Patient not taking: Reported on 04/14/2021), Disp: 30 mL, Rfl: 3   meloxicam (MOBIC) 15 MG tablet, Take 1 tablet (15 mg total) by mouth daily. Appt for further refills, Disp: 30 tablet, Rfl: 0   ofloxacin (OCUFLOX) 0.3 % ophthalmic solution, Place 2 drops into the left eye every 4 (four) hours. For 7 days. Use in right eye if develop symptoms only, Disp: 10 mL, Rfl: 0   triamcinolone cream (KENALOG) 0.1 %, Apply 1 Application topically 2 (two) times daily., Disp: 453.6 g, Rfl: 5   trimethoprim-polymyxin b (POLYTRIM) ophthalmic solution, Place 2 drops into the left eye every 6 (six) hours. X 5 days, Disp: 10 mL, Rfl: 0   Allergies  Allergen Reactions    Clavulanic Acid Nausea And Vomiting    Past Medical History:  Diagnosis Date   Environmental and seasonal allergies    Multiple allergies      Past Surgical History:  Procedure Laterality Date   TYMPANOSTOMY      Family History  Problem Relation Age of Onset   Healthy Father     Social History   Tobacco Use   Smoking status: Never   Smokeless tobacco: Current    Types: Chew  Vaping Use   Vaping Use: Never used  Substance Use Topics   Alcohol use: Yes    Comment: social   Drug use: No    ROS   Objective:   Vitals: BP 134/76 (BP Location: Right Arm)   Pulse (!) 55   Temp 97.8 F (36.6 C) (Oral)   Resp 16   SpO2 97%   Physical Exam Constitutional:      General: He is not in acute distress.    Appearance: Normal appearance. He is well-developed and normal weight. He is not ill-appearing, toxic-appearing or diaphoretic.  HENT:     Head: Normocephalic and atraumatic.     Right Ear: Ear canal and external ear normal. No drainage, swelling or tenderness. A middle ear effusion is present. There is no impacted cerumen. Tympanic membrane is erythematous and bulging.     Left Ear: Ear canal and external ear normal. No drainage, swelling or tenderness.  No middle ear effusion. There is no impacted cerumen.  Tympanic membrane is erythematous and bulging.     Nose: Congestion present. No rhinorrhea.     Mouth/Throat:     Mouth: Mucous membranes are moist.     Pharynx: No oropharyngeal exudate or posterior oropharyngeal erythema.  Eyes:     General: No scleral icterus.       Right eye: No discharge.        Left eye: No discharge.     Extraocular Movements: Extraocular movements intact.     Conjunctiva/sclera: Conjunctivae normal.  Cardiovascular:     Rate and Rhythm: Normal rate and regular rhythm.     Heart sounds: Normal heart sounds. No murmur heard.    No friction rub. No gallop.  Pulmonary:     Effort: Pulmonary effort is normal. No respiratory distress.      Breath sounds: Normal breath sounds. No stridor. No wheezing, rhonchi or rales.  Musculoskeletal:     Cervical back: Normal range of motion and neck supple. No rigidity. No muscular tenderness.  Neurological:     General: No focal deficit present.     Mental Status: He is alert and oriented to person, place, and time.  Psychiatric:        Mood and Affect: Mood normal.        Behavior: Behavior normal.        Thought Content: Thought content normal.     Assessment and Plan :   PDMP not reviewed this encounter.  1. Other acute nonsuppurative otitis media of both ears, recurrence not specified   2. Recurrent sinusitis   3. Allergic rhinitis, unspecified seasonality, unspecified trigger     Will use higher dose amoxicillin 875 mg twice a day for 10 days recurrent sinusitis and double otitis media.  Recommended a oral prednisone course for his eustachian tube dysfunction, allergic rhinitis.  Use supportive care otherwise.  Deferred imaging given clear cardiopulmonary exam, hemodynamically stable vital signs. Counseled patient on potential for adverse effects with medications prescribed/recommended today, ER and return-to-clinic precautions discussed, patient verbalized understanding.    Jaynee Eagles, [place] 05/26/22 1730

## 2022-05-26 NOTE — ED Triage Notes (Signed)
Right ear pulsating noise x 3 days. Nasal congestion, cough x 1.5 months. Taking nyquil with some relief

## 2022-05-26 NOTE — Discharge Instructions (Signed)
Take amoxicillin to address a double ear infection, sinus infection. Start prednisone to help with the sinus inflammation, eustachian tube dysfunction. Once you finish the prednisone you can use pseudoephedrine. Keep taking Xyzal daily.

## 2022-05-27 ENCOUNTER — Telehealth: Payer: Self-pay | Admitting: *Deleted

## 2022-05-27 NOTE — Telephone Encounter (Signed)
Called pt to follow up on his visit yesterday. He states he has taken two doses of the antibiotic and he still has ear pain but he is going to give it time. Advised we are happy to see him if he needs to be rechecked in office.

## 2022-06-01 ENCOUNTER — Encounter: Payer: Self-pay | Admitting: Physician Assistant

## 2022-06-05 ENCOUNTER — Encounter: Payer: Self-pay | Admitting: Family Medicine

## 2022-06-05 ENCOUNTER — Ambulatory Visit: Payer: Self-pay | Admitting: Family Medicine

## 2022-06-05 VITALS — BP 120/77 | HR 61 | Temp 98.7°F | Ht 73.0 in | Wt 248.1 lb

## 2022-06-05 DIAGNOSIS — H9221 Otorrhagia, right ear: Secondary | ICD-10-CM

## 2022-06-05 MED ORDER — CEFDINIR 300 MG PO CAPS: 300.0000 mg | ORAL_CAPSULE | Freq: Two times a day (BID) | ORAL | 0 refills | Status: DC

## 2022-06-05 NOTE — Progress Notes (Signed)
Acute Office Visit  Subjective:     Patient ID: Riggins Hanson, male    DOB: [birth year], 37 y.o.   MRN: [id number]  Chief Complaint  Patient presents with   Right ear bleeding    Saturday started to bleeding    HPI Patient is in today for right ear bleeding.  He says for about a month has been dealing with sinus congestion.  After about 2 weeks of symptoms he went to urgent care on February 9.  He was diagnosed with bilateral otitis media and sinusitis.  Started on amoxicillin and also given some prednisone.  Encouraged to take Sudafed as well.  He still has 1 more time of antibiotic left to take tonight.  He started getting some bleeding from the right ear on Saturday approximately 2 days ago.  His right ear has been throbbing for a couple of weeks.  He says he actually just had reconstruction done on that ear with Dr. Zigmund Daniel at [facility]. Still has mild cough as well but getting better.   ROS      Objective:    BP 120/77 (BP Location: Left Arm, Patient Position: Sitting, Cuff Size: Large)   Pulse 61   Temp 98.7 F (37.1 C) (Temporal)   Ht 6' 1"$  (1.854 m)   Wt 248 lb 1.9 oz (112.5 kg)   SpO2 98%   BMI 32.74 kg/m    Physical Exam Constitutional:      Appearance: Normal appearance.  HENT:     Head: Normocephalic and atraumatic.     Right Ear: External ear normal.     Left Ear: Ear canal and external ear normal.     Ears:     Comments: Left TM is abnormal.  Some mild erythema.  But he has some scar tissue that makes it difficult to see his landmarks well.  The right TM appears to be darkened and does appear to have a perforation.  The canal is completely "coated with fresh blood. Eyes:     Conjunctiva/sclera: Conjunctivae normal.  Cardiovascular:     Rate and Rhythm: Normal rate and regular rhythm.     Heart sounds: Normal heart sounds.  Pulmonary:     Effort: Pulmonary effort is normal.     Breath sounds: Normal breath sounds.     No results  found for any visits on 06/05/22.      Assessment & Plan:   Problem List Items Addressed This Visit   None Visit Diagnoses     Bleeding from ear, right    -  Primary      Bleeding from right ear-unfortunately it looks like he has a perforation again.  But I think a lot of the blood has to be cleared out of the canal to really get the best view possible.  We called ENT.  Dr. Zigmund Daniel has actually retired so he is can to see Dr. Venetia Maxon tomorrow at 130.  In the short-term I am going to switch him from amoxicillin to cefdinir.  He gets nausea and vomiting with the clavulanic acid.  He said there is actually supposed to be a tube in place in the right ear but I do not see 1.  Meds ordered this encounter  Medications   cefdinir (OMNICEF) 300 MG capsule    Sig: Take 1 capsule (300 mg total) by mouth 2 (two) times daily.    Dispense:  14 capsule    Refill:  0  No follow-ups on file.  Beatrice Lecher, MD

## 2022-07-14 ENCOUNTER — Encounter: Payer: Self-pay | Admitting: Emergency Medicine

## 2022-07-14 ENCOUNTER — Ambulatory Visit
Admission: EM | Admit: 2022-07-14 | Discharge: 2022-07-14 | Disposition: A | Discharge status: Home / Self Care | Payer: BC Managed Care – PPO | Attending: Family Medicine | Admitting: Family Medicine

## 2022-07-14 DIAGNOSIS — J0101 Acute recurrent maxillary sinusitis: Secondary | ICD-10-CM

## 2022-07-14 DIAGNOSIS — J069 Acute upper respiratory infection, unspecified: Secondary | ICD-10-CM

## 2022-07-14 MED ORDER — AMOXICILLIN 875 MG PO TABS: ORAL_TABLET | ORAL | 0 refills | Status: DC

## 2022-07-14 MED ORDER — PREDNISONE 20 MG PO TABS: ORAL_TABLET | ORAL | 0 refills | Status: DC

## 2022-07-14 NOTE — ED Triage Notes (Signed)
Sinus congestion & cough x 2 weeks OTC - tylenol  cold & flu & vitamin C & zinc Pt's wife was seen earlier today for a sinus infection Pt's son had flu last week, other son has croup

## 2022-07-14 NOTE — ED Provider Notes (Signed)
[facility]    CSN: [id number] Arrival date & time: 07/14/22  1658      History   Chief Complaint Chief Complaint  Patient presents with   Nasal Congestion    HPI Marc Lee is a 37 y.o. male.   Patient reports that he has had intermittent cough and congestion for several months.  During the past two days he has had increasing nasal drainage with development of facial pressure and mild sore throat.  He has had multiple bilateral ear surgeries, now with a ventilation tube in his right tympanic membrane.  He is chronically hearing impaired as a result.  Both his son and wife have had recent URI's.  The history is provided by the patient.    Past Medical History:  Diagnosis Date   Environmental and seasonal allergies    Multiple allergies     Patient Active Problem List   Diagnosis Date Noted   Sinobronchitis 09/04/2019   Left wrist injury 11/21/2017   Dysfunction of both eustachian tubes 06/15/2016   Low back pain 12/24/2015   Hyperlipidemia 09/27/2015   Well adult 09/24/2015   Dermatitis 09/24/2015   Tobacco dipper 09/24/2015   Allergic rhinitis 09/19/2012    Past Surgical History:  Procedure Laterality Date   TYMPANOSTOMY         Home Medications    Prior to Admission medications   Medication Sig Start Date End Date Taking? Authorizing Provider  amoxicillin (AMOXIL) 875 MG tablet Take one tab PO Q12hr 07/14/22  Yes Hattie Pine, Ishmael Holter, MD  predniSONE (DELTASONE) 20 MG tablet Take one tab by mouth twice daily for 4 days, then one daily. Take with food. 07/14/22  Yes Kandra Nicolas, MD  cetirizine (ZYRTEC) 10 MG tablet Take 10 mg by mouth daily. Patient not taking: Reported on 07/14/2022    [provider]  ciprofloxacin-dexamethasone (CIPRODEX) OTIC suspension Place 4 drops into the right ear 2 (two) times daily. X 7 days Patient not taking: Reported on 07/14/2022 04/14/21   Hali Marry, MD  fluticasone [facility]) 50 MCG/ACT  nasal spray Place 2 sprays into both nostrils daily.    [provider]  levocetirizine (XYZAL) 5 MG tablet Take 5 mg by mouth every evening.    [provider]  meloxicam (MOBIC) 15 MG tablet Take 1 tablet (15 mg total) by mouth daily. Appt for further refills 10/17/21   Breeback, Jade L, PA-C  ofloxacin (OCUFLOX) 0.3 % ophthalmic solution Place 2 drops into the left eye every 4 (four) hours. For 7 days. Use in right eye if develop symptoms only Patient not taking: Reported on 07/14/2022 04/19/21   Donella Stade, PA-C  promethazine-dextromethorphan (PROMETHAZINE-DM) 6.25-15 MG/5ML syrup Take 5 mLs by mouth 3 (three) times daily as needed for cough. Patient not taking: Reported on 07/14/2022 05/26/22   Jaynee Eagles, PA-C  pseudoephedrine (SUDAFED) 60 MG tablet Take 1 tablet (60 mg total) by mouth every 8 (eight) hours as needed for congestion. Patient not taking: Reported on 07/14/2022 05/26/22   Jaynee Eagles, PA-C  triamcinolone cream (KENALOG) 0.1 % Apply 1 Application topically 2 (two) times daily. 03/17/22   Hali Marry, MD  trimethoprim-polymyxin b (POLYTRIM) ophthalmic solution Place 2 drops into the left eye every 6 (six) hours. X 5 days Patient not taking: Reported on 07/14/2022 04/14/21   Hali Marry, MD    Family History Family History  Problem Relation Age of Onset   Healthy Father     Social  History Social History   Tobacco Use   Smoking status: Never    Passive exposure: Never   Smokeless tobacco: Current    Types: Chew  Vaping Use   Vaping Use: Never used  Substance Use Topics   Alcohol use: Yes    Comment: social   Drug use: No     Allergies   Clavulanic acid   Review of Systems Review of Systems + sore throat + cough No pleuritic pain No wheezing + nasal congestion + post-nasal drainage + sinus pain/pressure No itchy/red eyes No earache No hemoptysis No SOB No fever/chills No nausea No vomiting No abdominal pain No  diarrhea No urinary symptoms No skin rash + fatigue No myalgias No headache Used OTC meds (Zyrtec, Xyzal) without relief   Physical Exam Triage Vital Signs ED Triage Vitals  Enc Vitals Group     BP 07/14/22 1710 135/82     Pulse Rate 07/14/22 1710 86     Resp 07/14/22 1710 16     Temp 07/14/22 1710 98.5 F (36.9 C)     Temp Source 07/14/22 1710 Oral     SpO2 07/14/22 1710 98 %     Weight 07/14/22 1713 240 lb (108.9 kg)     Height 07/14/22 1713 6\' 1"  (1.854 m)     Head Circumference --      Peak Flow --      Pain Score 07/14/22 1712 3     Pain Loc --      Pain Edu? --      Excl. in [place]? --    No data found.  Updated Vital Signs BP 135/82 (BP Location: Left Arm)   Pulse 86   Temp 98.5 F (36.9 C) (Oral)   Resp 16   Ht 6\' 1"  (1.854 m)   Wt 108.9 kg   SpO2 98%   BMI 31.66 kg/m   Visual Acuity Right Eye Distance:   Left Eye Distance:   Bilateral Distance:    Right Eye Near:   Left Eye Near:    Bilateral Near:     Physical Exam Nursing notes and Vital Signs reviewed. Appearance:  Patient appears stated age, and in no acute distress Eyes:  Pupils are equal, round, and reactive to light and accomodation.  Extraocular movement is intact.  Conjunctivae are not inflamed  Ears:  Canals normal.  Right tympanic membrane scarred and erythematous with ventilation tube in place.  Left tympanic membrane scarred without erythema present. Nose:  Congested turbinates.   Maxillary sinus tenderness is present.  Pharynx:  Normal Neck:  Supple.  Mildly enlarged lateral nodes are present, tender to palpation on the left.   Lungs:  Clear to auscultation.  Breath sounds are equal.  Moving air well. Heart:  Regular rate and rhythm without murmurs, rubs, or gallops.  Abdomen:  Nontender without masses or hepatosplenomegaly.  Bowel sounds are present.  No CVA or flank tenderness.  Extremities:  No edema.  Skin:  No rash present.   UC Treatments / Results  Labs (all labs ordered are  listed, but only abnormal results are displayed) Labs Reviewed - No data to display  EKG   Radiology No results found.  Procedures Procedures (including critical care time)  Medications Ordered in UC Medications - No data to display  Initial Impression / Assessment and Plan / UC Course  I have reviewed the triage vital signs and the nursing notes.  Pertinent labs & imaging results that were available during my  care of the patient were reviewed by me and considered in my medical decision making (see chart for details).    Suspect recurrent right otitis media also. Begin Amoxicillin 875mg  Q12hr for 10 days, and prednisone burst/taper. Followup with Family Doctor if not improved in one week.   Final Clinical Impressions(s) / UC Diagnoses   Final diagnoses:  Acute recurrent maxillary sinusitis  Viral URI with cough     Discharge Instructions      Take plain guaifenesin (1200mg  extended release tabs such as Mucinex) twice daily, with plenty of water, for cough and congestion.  May add Pseudoephedrine (30mg , one or two every 4 to 6 hours) for sinus congestion.  Get adequate rest.   May use Afrin nasal spray (or generic oxymetazoline) each morning for about 5 days and then discontinue.  Also recommend using saline nasal spray several times daily and saline nasal irrigation (AYR is a common brand).  Use Flonase nasal spray each morning after using Afrin nasal spray and saline nasal irrigation. Try warm salt water gargles for sore throat.  Stop all antihistamines (Zyrtec and Zyxal) for now, and other non-prescription cough/cold preparations. May take left-over Promethazine-DM at bedtime if needed for cough.    ED Prescriptions     Medication Sig Dispense Auth. Provider   amoxicillin (AMOXIL) 875 MG tablet Take one tab PO Q12hr 20 tablet Kandra Nicolas, MD   predniSONE (DELTASONE) 20 MG tablet Take one tab by mouth twice daily for 4 days, then one daily. Take with food. 12  tablet Kandra Nicolas, MD         Kandra Nicolas, MD 07/16/22 [phone]

## 2022-07-14 NOTE — Discharge Instructions (Signed)
Take plain guaifenesin (1200mg  extended release tabs such as Mucinex) twice daily, with plenty of water, for cough and congestion.  May add Pseudoephedrine (30mg , one or two every 4 to 6 hours) for sinus congestion.  Get adequate rest.   May use Afrin nasal spray (or generic oxymetazoline) each morning for about 5 days and then discontinue.  Also recommend using saline nasal spray several times daily and saline nasal irrigation (AYR is a common brand).  Use Flonase nasal spray each morning after using Afrin nasal spray and saline nasal irrigation. Try warm salt water gargles for sore throat.  Stop all antihistamines (Zyrtec and Zyxal) for now, and other non-prescription cough/cold preparations. May take left-over Promethazine-DM at bedtime if needed for cough.

## 2022-09-25 DIAGNOSIS — H900 Conductive hearing loss, bilateral: Secondary | ICD-10-CM | POA: Diagnosis not present

## 2023-06-11 ENCOUNTER — Encounter: Payer: Self-pay | Admitting: Physician Assistant

## 2023-06-11 ENCOUNTER — Ambulatory Visit: Payer: BC Managed Care – PPO | Admitting: Physician Assistant

## 2023-06-11 VITALS — BP 123/90 | HR 69 | Ht 73.0 in | Wt 259.0 lb

## 2023-06-11 DIAGNOSIS — R0683 Snoring: Secondary | ICD-10-CM | POA: Diagnosis not present

## 2023-06-11 DIAGNOSIS — E66811 Obesity, class 1: Secondary | ICD-10-CM | POA: Diagnosis not present

## 2023-06-11 DIAGNOSIS — Z9189 Other specified personal risk factors, not elsewhere classified: Secondary | ICD-10-CM | POA: Diagnosis not present

## 2023-06-11 DIAGNOSIS — E6609 Other obesity due to excess calories: Secondary | ICD-10-CM | POA: Insufficient documentation

## 2023-06-11 DIAGNOSIS — J039 Acute tonsillitis, unspecified: Secondary | ICD-10-CM

## 2023-06-11 DIAGNOSIS — Z6834 Body mass index (BMI) 34.0-34.9, adult: Secondary | ICD-10-CM

## 2023-06-11 DIAGNOSIS — J029 Acute pharyngitis, unspecified: Secondary | ICD-10-CM

## 2023-06-11 DIAGNOSIS — J351 Hypertrophy of tonsils: Secondary | ICD-10-CM | POA: Insufficient documentation

## 2023-06-11 LAB — POCT RAPID STREP A (OFFICE): Rapid Strep A Screen: NEGATIVE

## 2023-06-11 MED ORDER — PREDNISONE 20 MG PO TABS: ORAL_TABLET | ORAL | 0 refills | Status: DC

## 2023-06-11 MED ORDER — AMOXICILLIN 875 MG PO TABS: 875.0000 mg | ORAL_TABLET | Freq: Two times a day (BID) | ORAL | 0 refills | Status: DC

## 2023-06-11 NOTE — Progress Notes (Signed)
 Acute Office Visit  Subjective:     Patient ID: Marc Lee, male    DOB: 09-16-1985, 38 y.o.   MRN: [id number]  CC: sore throat   HPI Patient is a 38 yo male who presents with a chief complaint of a sore throat. Onset last Friday. He endorses a dry cough, ear aches, swollen tonsils, but denies fever, chills, chest pain, shortness of breath. He states he had a hard time swallowing this morning. He denies any known sick contacts. He has tried Mucinex last night.   He also endorses trouble sleeping.   .. Active Ambulatory Problems    Diagnosis Date Noted   Allergic rhinitis 09/19/2012   Well adult 09/24/2015   Dermatitis 09/24/2015   Tobacco dipper 09/24/2015   Hyperlipidemia 09/27/2015   Low back pain 12/24/2015   Dysfunction of both eustachian tubes 06/15/2016   Left wrist injury 11/21/2017   Sinobronchitis 09/04/2019   Resolved Ambulatory Problems    Diagnosis Date Noted   Acute bronchitis 12/24/2015   Past Medical History:  Diagnosis Date   Environmental and seasonal allergies    Multiple allergies     Review of Systems  Constitutional:  Negative for chills and fever.  HENT:  Positive for congestion, ear pain, sinus pain and sore throat.   Respiratory:  Positive for cough. Negative for shortness of breath and wheezing.   Cardiovascular:  Negative for chest pain.       Snoring  Musculoskeletal:  Negative for myalgias.  Neurological:  Negative for headaches.  Psychiatric/Behavioral:  The patient has insomnia.       Objective:    There were no vitals taken for this visit. BP Readings from Last 3 Encounters:  06/11/23 (!) 123/90  07/14/22 135/82  06/05/22 120/77   Wt Readings from Last 3 Encounters:  06/11/23 117.5 kg  07/14/22 108.9 kg  06/05/22 112.5 kg   Physical Exam Constitutional:      Appearance: Normal appearance. He is obese.  HENT:     Head: Normocephalic and atraumatic.     Right Ear: Ear canal and external ear normal. Swelling present. A  middle ear effusion is present. Tympanic membrane is erythematous.     Left Ear: Ear canal and external ear normal.     Ears:     Comments: Left TM reconstructed - tube in place    Nose: Nose normal.     Mouth/Throat:     Mouth: Mucous membranes are moist.     Pharynx: Uvula midline. Posterior oropharyngeal erythema present.     Tonsils: 3+ on the right. 3+ on the left.     Comments: Hypertrophic tonsils but not touching Eyes:     Extraocular Movements: Extraocular movements intact.  Neck:     Comments: Neck circumference 43cm  Cardiovascular:     Rate and Rhythm: Normal rate and regular rhythm.     Pulses: Normal pulses.     Heart sounds: Normal heart sounds.  Musculoskeletal:     Cervical back: Normal range of motion. No tenderness.  Neurological:     Mental Status: He is alert.        Assessment & Plan:  .Marland KitchenVelma was seen today for sore throat.  Diagnoses and all orders for this visit:  Acute tonsillitis, unspecified etiology  Sore throat -     POCT rapid strep A  Snoring -     Home sleep test; Future  Large tonsils -     Home sleep test; Future  Class  1 obesity due to excess calories without serious comorbidity with body mass index (BMI) of 34.0 to 34.9 in adult -     Home sleep test; Future  At risk for obstructive sleep apnea -     Home sleep test; Future  Other orders -     amoxicillin (AMOXIL) 875 MG tablet; Take 1 tablet (875 mg total) by mouth 2 (two) times daily. -     predniSONE (DELTASONE) 20 MG tablet; Take one tablet twice a day.    Problem List Items Addressed This Visit   None - POC Rapid Strep A - Prednisone - Amoxicillin - Sleep study for suspected OSA  - Stop BANG: high risk for sleep apnea  Marc Lee, [place]

## 2023-06-11 NOTE — Patient Instructions (Signed)
 Will refer for home sleep study Tonsillitis  Tonsillitis is an infection of the throat. Tonsils are tissues in the back of your throat. This infection causes the tonsils to become red, tender, and swollen. What are the causes? Tonsillitis is caused by germs (bacteria or a virus). This condition can also occur when pieces of food and bacteria build up around the tonsils. Tonsillitis that is caused by germs can spread from person to person. What are the signs or symptoms? A sore throat. Trouble swallowing. White patches on the tonsils. Swollen tonsils. Fever. Headache. Tiredness. Not feeling hungry. Snoring during sleep when you did not snore before. Foul-smelling, yellowish-white pieces of material that you cough up or spit out. These can cause bad breath. How is this treated? Medicines. These can be given to treat pain, swelling, or fever. They can also be given to kill bacteria. Surgery to take out the tonsils. This is done if you have very bad infections that do not go away. Follow these instructions at home: Medicines Take over-the-counter and prescription medicines only as told by your doctor. If you were prescribed an antibiotic medicine, take it as told by your doctor. Do not stop taking the antibiotic even if you start to feel better. Eating and drinking Drink enough fluid to keep your pee (urine) pale yellow. While your throat is sore, eat soft or liquid foods, such as: Soup. Sherbet. Soft, warm cereals, such as oatmeal or hot wheat cereal. Drink warm fluids. Eat frozen ice pops. General instructions Rest as much as you can, and get plenty of sleep. Rinse your mouth often with salt water. To make salt water, dissolve -1 tsp (3-6 g) of salt in 1 cup (237 mL) of warm water. Do not swallow the salt water. Wash your hands often with soap and water for at least 20 seconds. If there is no soap and water, use hand sanitizer. Do not share cups, bottles, or other utensils until  your symptoms are gone. Do not smoke or use any products that contain nicotine or tobacco. If you need help quitting, ask your doctor. Keep all follow-up visits. Contact a doctor if: You have large, tender lumps in your neck that are new. You have a fever that does not go away after 2-3 days. You have a rash. You cough up green, yellow-brown, or bloody fluid. You cannot swallow liquids or food for 24 hours. Only one of your tonsils is swollen. Get help right away if: You have any new symptoms such as: Vomiting. Very bad headache. Stiff neck. Chest pain. Trouble breathing or swallowing. You have very bad throat pain, and you also have drooling or voice changes. You have very bad pain that is not helped by medicine. You cannot fully open your mouth. You have redness, swelling, or very bad pain anywhere in your neck. Summary Tonsillitis is an infection of the throat. It causes your tonsils to be red, tender, and swollen. While your throat is sore, eat soft or liquid foods. Rinse your mouth often with salt water. Do not share cups, bottles, or other utensils until your symptoms are gone. This information is not intended to replace advice given to you by your health care provider. Make sure you discuss any questions you have with your health care provider. Document Revised: 08/25/2020 Document Reviewed: 08/26/2020 Elsevier Patient Education  2024 [employer].

## 2023-06-12 ENCOUNTER — Encounter: Payer: Self-pay | Admitting: Physician Assistant

## 2023-08-09 ENCOUNTER — Ambulatory Visit: Admitting: Family Medicine

## 2023-08-09 ENCOUNTER — Encounter: Payer: Self-pay | Admitting: Family Medicine

## 2023-08-09 VITALS — BP 120/75 | HR 69 | Ht 73.0 in | Wt 257.0 lb

## 2023-08-09 DIAGNOSIS — J301 Allergic rhinitis due to pollen: Secondary | ICD-10-CM

## 2023-08-09 MED ORDER — METHYLPREDNISOLONE SODIUM SUCC 125 MG IJ SOLR
125.0000 mg | Freq: Once | INTRAMUSCULAR | Status: AC
  Administered 2023-08-09: 125 mg via INTRAMUSCULAR

## 2023-08-09 NOTE — Assessment & Plan Note (Signed)
 Having significant flare of allergies with increased post nasal drip and cough.  Given solu-medrol  125mg  injection today.  Continue xyzal daily.  Add astepro  daily as well. Red flags reviewed.  Contact clinic if not improving or if worsening.

## 2023-08-09 NOTE — Patient Instructions (Signed)
 Try adding on astepro  nasal spray daily.  Call if having worsening symptoms.

## 2023-08-09 NOTE — Progress Notes (Signed)
 Marc Lee - 38 y.o. male MRN [id number]  Date of birth: [birth year]  Subjective Chief Complaint  Patient presents with   Allergies    HPI Marc Lee is a 38 y.o. male here today with complaint of nasal congestion, cough, ear fullness and post nasal drainage.  Symptoms started a few days ago after blowing leaves.  He denies fever, sinus pain, purulent drainage or dizziness.  He has tried Xyzal and benadryl with some relief.  He has used flonase  as well.   ROS:  A comprehensive ROS was completed and negative except as noted per HPI  Allergies  Allergen Reactions   Clavulanic Acid Nausea And Vomiting    Past Medical History:  Diagnosis Date   Environmental and seasonal allergies    Multiple allergies     Past Surgical History:  Procedure Laterality Date   TYMPANOSTOMY      Social History   Socioeconomic History   Marital status: Married    Spouse name: Not on file   Number of children: Not on file   Years of education: Not on file   Highest education level: Not on file  Occupational History   Not on file  Tobacco Use   Smoking status: Never    Passive exposure: Never   Smokeless tobacco: Current    Types: Chew  Vaping Use   Vaping status: Never Used  Substance and Sexual Activity   Alcohol use: Yes    Comment: social   Drug use: No   Sexual activity: Yes  Other Topics Concern   Not on file  Social History Narrative   Not on file   Social Drivers of Health   Financial Resource Strain: Not on file  Food Insecurity: Not on file  Transportation Needs: Not on file  Physical Activity: Not on file  Stress: Not on file  Social Connections: Unknown (08/19/2021)   Received from [facility]   Social Network    Social Network: Not on file    Family History  Problem Relation Age of Onset   Healthy Father     Health Maintenance  Topic Date Due   HIV Screening  Never done   Hepatitis C Screening  Never done   COVID-19 Vaccine (1 -  2024-25 season) Never done   INFLUENZA VACCINE  11/16/2023   DTaP/Tdap/Td (2 - Td or Tdap) 09/09/2030   HPV VACCINES  Aged Out   Meningococcal B Vaccine  Aged Out     ----------------------------------------------------------------------------------------------------------------------------------------------------------------------------------------------------------------- Physical Exam BP 120/75 (BP Location: Left Arm, Patient Position: Sitting, Cuff Size: Large)   Pulse 69   Ht 6\' 1"  (1.854 m)   Wt 257 lb (116.6 kg)   SpO2 98%   BMI 33.91 kg/m   Physical Exam Constitutional:      Appearance: Normal appearance.  HENT:     Right Ear: Tympanic membrane normal.     Left Ear: Tympanic membrane normal.     Mouth/Throat:     Comments: B/l tonsillar enlargement without exudate Eyes:     General: No scleral icterus. Cardiovascular:     Rate and Rhythm: Normal rate and regular rhythm.  Pulmonary:     Effort: Pulmonary effort is normal.     Breath sounds: Normal breath sounds.  Musculoskeletal:     Cervical back: Neck supple.  Neurological:     Mental Status: He is alert.  Psychiatric:        Mood and Affect: Mood normal.  Behavior: Behavior normal.     ------------------------------------------------------------------------------------------------------------------------------------------------------------------------------------------------------------------- Assessment and Plan  Allergic rhinitis Having significant flare of allergies with increased post nasal drip and cough.  Given solu-medrol  125mg  injection today.  Continue xyzal daily.  Add astepro  daily as well. Red flags reviewed.  Contact clinic if not improving or if worsening.    Meds ordered this encounter  Medications   methylPREDNISolone  sodium succinate (SOLU-MEDROL ) 125 mg/2 mL injection 125 mg    No follow-ups on file.

## 2023-11-08 ENCOUNTER — Ambulatory Visit: Payer: Self-pay

## 2023-11-08 NOTE — Telephone Encounter (Signed)
 FYI Only or Action Required?: Action required by provider: request for appointment.  Patient was last seen in primary care on 08/09/2023 by Alvia Bring, DO.  Called Nurse Triage reporting ear symptoms.  Symptoms began today.  Interventions attempted: Nothing.  Symptoms are: stable. Woke up this morning with bloody drainage from right ear, no pain or other symptoms.  Triage Disposition: See Physician Within 24 Hours  Patient/caregiver understands and will follow disposition?: Yes    Copied from CRM [id number]. Topic: Clinical - Red Word Triage >> Nov 08, 2023  8:08 AM Laurier BROCKS wrote: Red Word that prompted transfer to Nurse Triage: Patient's spouse Bruno states patient is bleeding is from the left ear he noticed it this morning on his pillow. He is able to stop the bleeding, no pain Answer Assessment - Initial Assessment Questions 1. LOCATION: Which ear is involved?     Right ear 2. COLOR: What is the color of the discharge?      red 3. CONSISTENCY: How runny is the discharge? Could it be water?      no 4. ONSET: When did you first notice the discharge?     today 5. PAIN: Is there any earache? How bad is it?  (Scale 0-10; none, mild, moderate or severe)     Not today 6. OBJECTS: Have you put anything in your ear? (e.g., Q-tip, other object)      no 7. OTHER SYMPTOMS: Do you have any other symptoms? (e.g., headache, fever, dizziness, vomiting, runny nose)     no 8. PREGNANCY: Is there any chance you are pregnant? When was your last menstrual period?     N/a  Protocols used: Ear [facility]  Reason for Disposition  Unexplained bleeding from ear  Answer Assessment - Initial Assessment Questions 1. LOCATION: Which ear is involved?     Right ear 2. COLOR: What is the color of the discharge?      red 3. CONSISTENCY: How runny is the discharge? Could it be water?      no 4. ONSET: When did you first notice the discharge?     today 5.  PAIN: Is there any earache? How bad is it?  (Scale 0-10; none, mild, moderate or severe)     Not today 6. OBJECTS: Have you put anything in your ear? (e.g., Q-tip, other object)      no 7. OTHER SYMPTOMS: Do you have any other symptoms? (e.g., headache, fever, dizziness, vomiting, runny nose)     no 8. PREGNANCY: Is there any chance you are pregnant? When was your last menstrual period?     N/a  Protocols used: Ear - Discharge-A-[facility]

## 2023-11-09 ENCOUNTER — Ambulatory Visit: Admitting: Medical-Surgical

## 2023-11-09 ENCOUNTER — Encounter: Payer: Self-pay | Admitting: Medical-Surgical

## 2023-11-09 VITALS — BP 111/69 | HR 70 | Resp 20 | Ht 73.0 in | Wt 252.0 lb

## 2023-11-09 DIAGNOSIS — J301 Allergic rhinitis due to pollen: Secondary | ICD-10-CM | POA: Diagnosis not present

## 2023-11-09 DIAGNOSIS — H9221 Otorrhagia, right ear: Secondary | ICD-10-CM | POA: Diagnosis not present

## 2023-11-09 NOTE — Progress Notes (Signed)
        Established patient visit  Discussed the use of AI scribe software for clinical note transcription with the patient, who gave verbal consent to proceed.  History of Present Illness   Marc Lee is a 38 year old male who presents with ear bleeding and muffled hearing.  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  Constitutional:      General: He is not in acute distress.    Appearance: Normal appearance.  HENT:     Head: Normocephalic and atraumatic.     Right Ear: Decreased hearing noted. Drainage (bloody drainage from lower edge of TM into the canal, visible at the edge of the outer ear) present. There is no impacted cerumen. A PE tube is present.     Left Ear: Hearing, tympanic membrane, ear canal and external ear normal.     Ears:     Comments: Right ear teal colored T tube present, displaced position leaning toward the 4-5 o'clock position. Unable to visualize the entire TM due to presence of blood obscuring the lower portion. Cardiovascular:     Rate and Rhythm: Normal rate and regular rhythm.     Pulses: Normal pulses.     Heart sounds: Normal heart sounds. No murmur heard.    No friction rub. No gallop.  Pulmonary:     Effort: Pulmonary effort is normal. No respiratory distress.     Breath sounds: Normal breath sounds.  Skin:    General: Skin is warm and dry.  Neurological:     Mental Status: He is alert and oriented to  person, place, and time.  Psychiatric:        Mood and Affect: Mood normal.        Behavior: Behavior normal.        Thought Content: Thought content normal.        Judgment: Judgment normal.     Assessment and Plan    Right Ear Bleeding Bright red bleeding likely from shifted T tube, no pain, limited visualization due to blood, unable to determine if TM ruptured. Patient reports this is a recurrent issue. - Monitor symptoms. - Contact ENT if pain or infection signs develop. - Avoid ear drops. - Contact ENT if bleeding persists or worsens.  Nasal Congestion Uses Flonase  for relief, educated on proper administration, advised regular use for control. - Educate on Flonase  administration. - Advise regular Flonase  use.      Return if symptoms worsen or fail to improve.  __________________________________ Marc FREDRIK Palin, DNP, APRN, FNP-BC Primary Care and Sports Medicine [facility] [place]

## 2023-11-10 DIAGNOSIS — H9221 Otorrhagia, right ear: Secondary | ICD-10-CM | POA: Insufficient documentation

## 2024-03-17 DIAGNOSIS — M9901 Segmental and somatic dysfunction of cervical region: Secondary | ICD-10-CM | POA: Diagnosis not present

## 2024-03-17 DIAGNOSIS — M62830 Muscle spasm of back: Secondary | ICD-10-CM | POA: Diagnosis not present

## 2024-03-17 DIAGNOSIS — M5413 Radiculopathy, cervicothoracic region: Secondary | ICD-10-CM | POA: Diagnosis not present

## 2024-03-17 DIAGNOSIS — M9903 Segmental and somatic dysfunction of lumbar region: Secondary | ICD-10-CM | POA: Diagnosis not present

## 2024-03-24 DIAGNOSIS — M9901 Segmental and somatic dysfunction of cervical region: Secondary | ICD-10-CM | POA: Diagnosis not present

## 2024-03-24 DIAGNOSIS — M62830 Muscle spasm of back: Secondary | ICD-10-CM | POA: Diagnosis not present

## 2024-03-24 DIAGNOSIS — M9903 Segmental and somatic dysfunction of lumbar region: Secondary | ICD-10-CM | POA: Diagnosis not present

## 2024-03-24 DIAGNOSIS — M5413 Radiculopathy, cervicothoracic region: Secondary | ICD-10-CM | POA: Diagnosis not present

## 2024-04-03 DIAGNOSIS — M62830 Muscle spasm of back: Secondary | ICD-10-CM | POA: Diagnosis not present

## 2024-04-03 DIAGNOSIS — M9903 Segmental and somatic dysfunction of lumbar region: Secondary | ICD-10-CM | POA: Diagnosis not present

## 2024-04-03 DIAGNOSIS — M5413 Radiculopathy, cervicothoracic region: Secondary | ICD-10-CM | POA: Diagnosis not present

## 2024-04-03 DIAGNOSIS — M9901 Segmental and somatic dysfunction of cervical region: Secondary | ICD-10-CM | POA: Diagnosis not present

## 2024-05-20 ENCOUNTER — Telehealth: Admitting: Physician Assistant

## 2024-05-20 DIAGNOSIS — J208 Acute bronchitis due to other specified organisms: Secondary | ICD-10-CM

## 2024-05-20 MED ORDER — BENZONATATE 100 MG PO CAPS: 100.0000 mg | ORAL_CAPSULE | Freq: Three times a day (TID) | ORAL | 0 refills | Status: AC | PRN

## 2024-05-20 MED ORDER — ALBUTEROL SULFATE HFA 108 (90 BASE) MCG/ACT IN AERS: 1.0000 | INHALATION_SPRAY | Freq: Four times a day (QID) | RESPIRATORY_TRACT | 0 refills | Status: AC | PRN
